# Patient Record
Sex: Male | Born: 1980 | Race: Black or African American | Hispanic: No | Marital: Single | Smoking: Former smoker
Health system: Southern US, Community
[De-identification: ages and names within clinical notes are randomized; demographics above are authoritative.]

## PROBLEM LIST (undated history)

## (undated) DIAGNOSIS — G44009 Cluster headache syndrome, unspecified, not intractable: Secondary | ICD-10-CM

## (undated) DIAGNOSIS — L709 Acne, unspecified: Secondary | ICD-10-CM

## (undated) DIAGNOSIS — M9250 Juvenile osteochondrosis of tibia and fibula, unspecified leg: Secondary | ICD-10-CM

## (undated) DIAGNOSIS — I10 Essential (primary) hypertension: Secondary | ICD-10-CM

## (undated) DIAGNOSIS — W3400XA Accidental discharge from unspecified firearms or gun, initial encounter: Secondary | ICD-10-CM

## (undated) DIAGNOSIS — M21371 Foot drop, right foot: Secondary | ICD-10-CM

## (undated) DIAGNOSIS — M92529 Juvenile osteochondrosis of tibia tubercle, unspecified leg: Secondary | ICD-10-CM

## (undated) DIAGNOSIS — S31139A Puncture wound of abdominal wall without foreign body, unspecified quadrant without penetration into peritoneal cavity, initial encounter: Secondary | ICD-10-CM

## (undated) HISTORY — DX: Juvenile osteochondrosis of tibia tubercle, unspecified leg: M92.529

## (undated) HISTORY — DX: Acne, unspecified: L70.9

## (undated) HISTORY — PX: COLECTOMY: SHX59

## (undated) HISTORY — PX: HERNIA REPAIR: SHX51

## (undated) HISTORY — DX: Juvenile osteochondrosis of tibia and fibula, unspecified leg: M92.50

## (undated) HISTORY — PX: TESTICLE REMOVAL: SHX68

---
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
## 2003-05-31 ENCOUNTER — Inpatient Hospital Stay (HOSPITAL_COMMUNITY): Admission: EM | Admit: 2003-05-31 | Discharge: 2003-06-02 | Payer: Self-pay | Admitting: Emergency Medicine

## 2003-05-31 ENCOUNTER — Encounter: Payer: Self-pay | Admitting: Emergency Medicine

## 2003-06-02 ENCOUNTER — Encounter: Payer: Self-pay | Admitting: Internal Medicine

## 2003-06-03 ENCOUNTER — Emergency Department (HOSPITAL_COMMUNITY): Admission: EM | Admit: 2003-06-03 | Discharge: 2003-06-03 | Payer: Self-pay | Admitting: Emergency Medicine

## 2003-08-05 ENCOUNTER — Ambulatory Visit (HOSPITAL_COMMUNITY): Admission: RE | Admit: 2003-08-05 | Discharge: 2003-08-05 | Payer: Self-pay | Admitting: Gastroenterology

## 2009-06-06 ENCOUNTER — Encounter (INDEPENDENT_AMBULATORY_CARE_PROVIDER_SITE_OTHER): Payer: Self-pay | Admitting: General Surgery

## 2009-06-06 ENCOUNTER — Inpatient Hospital Stay (HOSPITAL_COMMUNITY): Admission: AC | Admit: 2009-06-06 | Discharge: 2009-06-13 | Payer: Self-pay

## 2009-06-09 ENCOUNTER — Ambulatory Visit: Payer: Self-pay | Admitting: Physical Medicine & Rehabilitation

## 2009-07-06 ENCOUNTER — Emergency Department (HOSPITAL_COMMUNITY): Admission: EM | Admit: 2009-07-06 | Discharge: 2009-07-06 | Payer: Self-pay | Admitting: Emergency Medicine

## 2009-07-26 ENCOUNTER — Encounter: Admission: RE | Admit: 2009-07-26 | Discharge: 2009-08-24 | Payer: Self-pay | Admitting: Orthopedic Surgery

## 2009-09-07 ENCOUNTER — Encounter: Admission: RE | Admit: 2009-09-07 | Discharge: 2009-12-06 | Payer: Self-pay | Admitting: Orthopedic Surgery

## 2009-10-01 ENCOUNTER — Emergency Department (HOSPITAL_COMMUNITY): Admission: EM | Admit: 2009-10-01 | Discharge: 2009-10-01 | Payer: Self-pay | Admitting: Emergency Medicine

## 2010-01-10 ENCOUNTER — Encounter: Admission: RE | Admit: 2010-01-10 | Discharge: 2010-02-01 | Payer: Self-pay | Admitting: Orthopedic Surgery

## 2010-01-17 ENCOUNTER — Emergency Department (HOSPITAL_COMMUNITY): Admission: EM | Admit: 2010-01-17 | Discharge: 2010-01-17 | Payer: Self-pay | Admitting: Family Medicine

## 2010-08-11 ENCOUNTER — Ambulatory Visit: Payer: Self-pay | Admitting: Family Medicine

## 2010-08-11 DIAGNOSIS — M216X9 Other acquired deformities of unspecified foot: Secondary | ICD-10-CM | POA: Insufficient documentation

## 2010-08-11 DIAGNOSIS — G609 Hereditary and idiopathic neuropathy, unspecified: Secondary | ICD-10-CM | POA: Insufficient documentation

## 2010-09-06 ENCOUNTER — Ambulatory Visit
Admission: RE | Admit: 2010-09-06 | Discharge: 2010-09-06 | Payer: Self-pay | Source: Home / Self Care | Attending: Sports Medicine | Admitting: Sports Medicine

## 2010-09-28 ENCOUNTER — Ambulatory Visit: Payer: Self-pay | Admitting: Family Medicine

## 2010-09-28 ENCOUNTER — Encounter: Payer: Self-pay | Admitting: Family Medicine

## 2010-09-28 ENCOUNTER — Ambulatory Visit: Admit: 2010-09-28 | Payer: Self-pay

## 2010-09-28 DIAGNOSIS — J45909 Unspecified asthma, uncomplicated: Secondary | ICD-10-CM | POA: Insufficient documentation
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
## 2010-10-11 ENCOUNTER — Ambulatory Visit: Payer: Self-pay | Attending: Family Medicine | Admitting: Physical Therapy

## 2010-10-11 DIAGNOSIS — R262 Difficulty in walking, not elsewhere classified: Secondary | ICD-10-CM | POA: Insufficient documentation

## 2010-10-11 DIAGNOSIS — M545 Low back pain, unspecified: Secondary | ICD-10-CM | POA: Insufficient documentation

## 2010-10-11 DIAGNOSIS — IMO0001 Reserved for inherently not codable concepts without codable children: Secondary | ICD-10-CM | POA: Insufficient documentation

## 2010-10-11 DIAGNOSIS — M216X9 Other acquired deformities of unspecified foot: Secondary | ICD-10-CM | POA: Insufficient documentation

## 2010-11-06 ENCOUNTER — Ambulatory Visit: Payer: Self-pay | Admitting: Physical Therapy

## 2010-11-15 LAB — URINALYSIS, ROUTINE W REFLEX MICROSCOPIC
Bilirubin Urine: NEGATIVE
Hgb urine dipstick: NEGATIVE
Protein, ur: 30 mg/dL — AB
Specific Gravity, Urine: 1.024 (ref 1.005–1.030)
Urobilinogen, UA: 1 mg/dL (ref 0.0–1.0)
pH: 8.5 — ABNORMAL HIGH (ref 5.0–8.0)

## 2010-11-15 LAB — CBC
HCT: 46 % (ref 39.0–52.0)
Hemoglobin: 15.7 g/dL (ref 13.0–17.0)
RDW: 15.5 % (ref 11.5–15.5)
WBC: 8.8 10*3/uL (ref 4.0–10.5)

## 2010-11-15 LAB — BASIC METABOLIC PANEL
CO2: 24 mEq/L (ref 19–32)
Chloride: 100 mEq/L (ref 96–112)
Creatinine, Ser: 0.8 mg/dL (ref 0.4–1.5)
GFR calc Af Amer: >60 mL/min (ref >60)
GFR calc non Af Amer: >60 mL/min (ref >60)
Glucose, Bld: 114 mg/dL — ABNORMAL HIGH (ref 70–99)

## 2010-11-15 LAB — DIFFERENTIAL
Eosinophils Relative: 0 % (ref 0–5)
Lymphocytes Relative: 5 % — ABNORMAL LOW (ref 12–46)
Monocytes Absolute: 0.9 10*3/uL (ref 0.1–1.0)
Neutro Abs: 7.4 10*3/uL (ref 1.7–7.7)
Neutrophils Relative %: 84 % — ABNORMAL HIGH (ref 43–77)

## 2010-11-15 LAB — URINE MICROSCOPIC-ADD ON

## 2010-11-28 ENCOUNTER — Ambulatory Visit: Payer: Self-pay | Admitting: Rehabilitation

## 2010-11-30 LAB — DIFFERENTIAL
Basophils Absolute: 0.1 10*3/uL (ref 0.0–0.1)
Basophils Relative: 1 % (ref 0–1)
Basophils Relative: 1 % (ref 0–1)
Eosinophils Absolute: 0 10*3/uL (ref 0.0–0.7)
Eosinophils Absolute: 0 10*3/uL (ref 0.0–0.7)
Eosinophils Relative: 0 % (ref 0–5)
Eosinophils Relative: 1 % (ref 0–5)
Lymphocytes Relative: 8 % — ABNORMAL LOW (ref 12–46)
Lymphs Abs: 1.1 10*3/uL (ref 0.7–4.0)
Monocytes Absolute: 0.9 10*3/uL (ref 0.1–1.0)
Monocytes Absolute: 1.1 10*3/uL — ABNORMAL HIGH (ref 0.1–1.0)
Monocytes Relative: 15 % — ABNORMAL HIGH (ref 3–12)
Neutro Abs: 6.4 10*3/uL (ref 1.7–7.7)
Neutrophils Relative %: 76 % (ref 43–77)

## 2010-11-30 LAB — PROTIME-INR
INR: 1.15 (ref 0.00–1.49)
Prothrombin Time: 14.6 seconds (ref 11.6–15.2)

## 2010-11-30 LAB — PREPARE FRESH FROZEN PLASMA

## 2010-11-30 LAB — POCT CARDIAC MARKERS
CKMB, poc: <1 ng/mL — ABNORMAL LOW (ref 1.0–8.0)
Myoglobin, poc: 197 ng/mL (ref 12–200)

## 2010-11-30 LAB — CBC
HCT: 27 % — ABNORMAL LOW (ref 39.0–52.0)
HCT: 29.6 % — ABNORMAL LOW (ref 39.0–52.0)
HCT: 36.2 % — ABNORMAL LOW (ref 39.0–52.0)
HCT: 36.5 % — ABNORMAL LOW (ref 39.0–52.0)
Hemoglobin: 10.2 g/dL — ABNORMAL LOW (ref 13.0–17.0)
Hemoglobin: 10.2 g/dL — ABNORMAL LOW (ref 13.0–17.0)
Hemoglobin: 11.6 g/dL — ABNORMAL LOW (ref 13.0–17.0)
Hemoglobin: 12.8 g/dL — ABNORMAL LOW (ref 13.0–17.0)
Hemoglobin: 9.3 g/dL — ABNORMAL LOW (ref 13.0–17.0)
MCHC: 34.4 g/dL (ref 30.0–36.0)
MCHC: 34.5 g/dL (ref 30.0–36.0)
MCHC: 34.9 g/dL (ref 30.0–36.0)
MCV: 95.9 fL (ref 78.0–100.0)
MCV: 96 fL (ref 78.0–100.0)
MCV: 96.4 fL (ref 78.0–100.0)
MCV: 96.7 fL (ref 78.0–100.0)
MCV: 98 fL (ref 78.0–100.0)
Platelets: 146 10*3/uL — ABNORMAL LOW (ref 150–400)
Platelets: 199 10*3/uL (ref 150–400)
Platelets: 232 10*3/uL (ref 150–400)
Platelets: 292 10*3/uL (ref 150–400)
RBC: 2.8 MIL/uL — ABNORMAL LOW (ref 4.22–5.81)
RBC: 3.04 MIL/uL — ABNORMAL LOW (ref 4.22–5.81)
RBC: 3.49 MIL/uL — ABNORMAL LOW (ref 4.22–5.81)
RBC: 3.83 MIL/uL — ABNORMAL LOW (ref 4.22–5.81)
RBC: 4.18 MIL/uL — ABNORMAL LOW (ref 4.22–5.81)
RDW: 14.1 % (ref 11.5–15.5)
RDW: 14.6 % (ref 11.5–15.5)
RDW: 15.4 % (ref 11.5–15.5)
RDW: 15.5 % (ref 11.5–15.5)
RDW: 15.7 % — ABNORMAL HIGH (ref 11.5–15.5)

## 2010-11-30 LAB — BASIC METABOLIC PANEL
BUN: 2 mg/dL — ABNORMAL LOW (ref 6–23)
Calcium: 6.8 mg/dL — ABNORMAL LOW (ref 8.4–10.5)
Chloride: 104 mEq/L (ref 96–112)
Creatinine, Ser: 1.07 mg/dL (ref 0.4–1.5)
GFR calc Af Amer: >60 mL/min (ref >60)
Glucose, Bld: 105 mg/dL — ABNORMAL HIGH (ref 70–99)
Glucose, Bld: 249 mg/dL — ABNORMAL HIGH (ref 70–99)
Potassium: 3.5 mEq/L (ref 3.5–5.1)
Sodium: 136 mEq/L (ref 135–145)
Sodium: 139 mEq/L (ref 135–145)

## 2010-11-30 LAB — HEMOGLOBIN AND HEMATOCRIT, BLOOD
HCT: 33.1 % — ABNORMAL LOW (ref 39.0–52.0)
Hemoglobin: 11.3 g/dL — ABNORMAL LOW (ref 13.0–17.0)

## 2010-11-30 LAB — POCT I-STAT, CHEM 8
Calcium, Ion: 0.98 mmol/L — ABNORMAL LOW (ref 1.12–1.32)
Glucose, Bld: 173 mg/dL — ABNORMAL HIGH (ref 70–99)
Hemoglobin: 14.6 g/dL (ref 13.0–17.0)
Sodium: 140 meq/L (ref 135–145)
TCO2: 14 mmol/L (ref 0–100)

## 2010-11-30 LAB — GLUCOSE, CAPILLARY: Glucose-Capillary: 112 mg/dL — ABNORMAL HIGH (ref 70–99)

## 2010-11-30 LAB — TYPE AND SCREEN

## 2010-11-30 LAB — LACTIC ACID, PLASMA: Lactic Acid, Venous: 9.8 mmol/L — ABNORMAL HIGH (ref 0.5–2.2)

## 2010-12-11 ENCOUNTER — Ambulatory Visit: Payer: Self-pay | Attending: Family Medicine | Admitting: Physical Therapy

## 2010-12-11 DIAGNOSIS — IMO0001 Reserved for inherently not codable concepts without codable children: Secondary | ICD-10-CM | POA: Insufficient documentation

## 2010-12-11 DIAGNOSIS — R262 Difficulty in walking, not elsewhere classified: Secondary | ICD-10-CM | POA: Insufficient documentation

## 2010-12-11 DIAGNOSIS — M545 Low back pain, unspecified: Secondary | ICD-10-CM | POA: Insufficient documentation

## 2010-12-11 DIAGNOSIS — M216X9 Other acquired deformities of unspecified foot: Secondary | ICD-10-CM | POA: Insufficient documentation

## 2010-12-18 ENCOUNTER — Ambulatory Visit: Payer: Self-pay | Admitting: Physical Therapy

## 2010-12-25 ENCOUNTER — Ambulatory Visit: Payer: Self-pay | Admitting: Physical Therapy

## 2011-01-01 ENCOUNTER — Ambulatory Visit: Payer: Self-pay | Attending: Family Medicine | Admitting: Physical Therapy

## 2011-01-01 DIAGNOSIS — M545 Low back pain, unspecified: Secondary | ICD-10-CM | POA: Insufficient documentation

## 2011-01-01 DIAGNOSIS — M216X9 Other acquired deformities of unspecified foot: Secondary | ICD-10-CM | POA: Insufficient documentation

## 2011-01-01 DIAGNOSIS — R262 Difficulty in walking, not elsewhere classified: Secondary | ICD-10-CM | POA: Insufficient documentation

## 2011-01-01 DIAGNOSIS — IMO0001 Reserved for inherently not codable concepts without codable children: Secondary | ICD-10-CM | POA: Insufficient documentation

## 2011-01-12 NOTE — Discharge Summary (Signed)
NAMEPHIL, William Kane NO.:  [id number]   MEDICAL RECORD NO.:  [id number]                   PATIENT TYPE:  INP   LOCATION:  6703                                 FACILITY:  [facility]   PHYSICIAN:  Norwood Levo, MD               DATE OF BIRTH:  [birth year]   DATE OF ADMISSION:  05/31/2003  DATE OF DISCHARGE:  06/02/2003                                 DISCHARGE SUMMARY   ENCOMPASS DISCHARGE SUMMARY:   PRIMARY CARE PHYSICIAN:  Sharlot Gowda, M.D.   CHIEF COMPLAINT:  Abdominal pain, nausea, vomiting, and diarrhea.   HISTORY OF PRESENT ILLNESS:  A 30 year old African-American maleican male with a past  medical history significant for asthma and cluster headaches presented to  the emergency room with an episode of transient abdominal pain located all  over.  Nausea and vomiting x1 with runny stools.  At approximately 12 noon  the day of admission, the patient had eaten fried pork chops, french fries,  and 10 minutes later began to have crampy abdominal pain and diarrhea.  He  took [employer], but had another episode of vomiting.  He denies any  hematemesis, bloody stool, no black tarry stool.  He had three to four days  of runny stools the same day of admission and that evening as well.  He  states that the stool appeared a clear yellow with some mucus.  He denies  any bright red blood per rectum or any coffee ground emesis.  He denies any  fevers, chills, cough, any painful urination or weight loss.  He did  describe a sore on his left elbow a week prior with resolution.  He denies  any swelling or erythema or rash over the elbow.  No conjunctival  irritation, urethral discharge.  He denies any history of anal intercourse  or homosexual activity.  He states that he had eaten at [employer] and  another local seafood restaurant a day prior to admission with a combination  of shrimp and lobster, tossed salad with ranch dressing, crab dip, and  leftover steak.   His girlfriend did eat similar foods at the seafood  restaurant, but was symptom free.   PAST MEDICAL HISTORY:  1. Asthma.  2. Cluster headaches.  3. Status post inguinal hernia repair at age [age].  4. 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   MEDICATIONS:  The patient denies any medications on a regular basis.  Uses  OTC Claritin and Benadryl p.r.n.  Does use a Ventolin inhaler p.r.n.   ALLERGIES:  No known drug allergies.   SOCIAL HISTORY:  Single, lives with his girlfriend in [place], [place].  Employed.  Smokes 1/2 package of cigarettes per day over the last four  years.  He smokes marijuana daily.  He drinks beer occasionally, and denies  any other polysubstance abuse or intravenous drug use.  He  completed grade  twelve and he is literate.   FAMILY HISTORY:  Mother is 25 years of age, has hypertension and asthma.  Father is 41 years of age without any health problems.  The patient has  seven siblings with one brother with an asthma history.   REVIEW OF SYSTEMS:  All negative except for what is described in the HPI.   ADMISSION PHYSICAL EXAMINATION:  VITAL SIGNS:  Temperature 98.9, blood  pressure 114/47, pulse 50, respiratory rate 20, pulse oximetry 99% on room  air.  GENERAL:  The patient is a pleasant, well-nourished, 30 year old African-  American male in no acute distress status post morphine in the emergency  room.  HEENT:  NCAT.  PERRL.  EOMI.  Nonicteric conjunctivae.  Tympanic membranes  clear bilaterally.  Nasal mucosa moist without discharge.  No sinus  tenderness.  Good dentition.  No exudates or erythema in the oral or nasal  vault.  NECK:  Supple, no adenopathy, no thyromegaly, no bruits.  LUNGS:  CTAP.  HEART:  RRR.  S1 and S2.  ABDOMEN:  Bowel sounds are hypoactive.  Abdomen is soft, nontender,  nondistended, without masses, no hepatosplenomegaly.  RECTAL:  A small amount of rectal stool in the vault with negative guaiac as  per  Dr. Jacques Navy exam in the ED.  No rectal masses or rectal fissures or  anal abnormalities.  EXTREMITIES:  No CCE.  Good range of motion in all major joints without  joint abnormalities.  Pedal pulses 2+ bilaterally.  No Achille tendinitis,  no pretibial edema.  SKIN:  No rashes.  NEUROLOGIC:  A&O x3, 7 through 12 grossly intact.   ADMISSION LABORATORY DATA:  Urinalysis is within normal limits.  Sodium 139,  potassium 4.1, chloride 105, BUN 13, glucose 90, creatinine 0.9, WBC 7.8,  hemoglobin 15.6, hematocrit 45.8, MCV 94.5, and platelet count 259,000.  Abdominal CAT scan shows thickening of the walls of the distal small bowel  with a small amount of free pelvic fluid.   HOSPITAL COURSE:  The patient was admitted to a regular medical ward for  presumptive gastroenteritis with possible ileitis secondary to IBD, possible  Crohn's as per the abdominal CAT scan.  He was given bowel rest with  intravenous fluids, n.p.o. except for ice chips.  He was given Protonix  intravenously and antibiotic coverage with Cipro 400 IV q.12h. and Flagyl  500 IV q.8h.  His intravenous fluids are D5 normal saline with 20 mEq of KCL  at 150 per hour.  The patient undergoes stool evaluation for routine  culture, O&P, WBCs, C. diff, and E. histolytica.  The patient is also  evaluated by Dr. Madilyn Fireman of gastroenterology for possible IBD.   Erythrocyte sedimentation rate is 10 and 1.  Urinalysis is negative.  Liver  function tests are in the norm.  Amylase and lipase are also noted.  Stool  cultures, O&P, and C. difficile are negative to date.  Other serologies are  pending.  In 48 hours, the patient remains afebrile without any laboratory  abnormalities and no WBC count abnormality.  After discussion with Dr.  Madilyn Fireman, bowel rest was continued.  Within 24 hours the patient's diet is  advanced from clear liquids to full liquids to an advanced diet of low residue.  The patient does have some dyspepsia with his first  full meal, but  no right lower quadrant pain.  After discussion with the patient and the  patient's mother, and Dr. Madilyn Fireman, a decision was made to  advance diet,  observe, and discharge the patient with gastroenterology followup in two  weeks with Dr. Madilyn Fireman for possible further evaluation by BD if indicated with  possible colonoscopy.  These things have been explained to the patient and  mother, and to the patient repeatedly.  Followup KUB the morning of  discharge has no abnormalities.  Thus, intravenous fluids are discontinued,  Flagyl is discontinued, antibiotics are changed to Cipro orally 500 p.o.  b.i.d.  The patient is changed from intravenous morphine for transient pain  to Vicodin.   He is covered prophylactically with NicoDerm patch for his tobacco use and  with Ventolin p.r.n., but is without asthmatic episodes.   DISCHARGE DIAGNOSES:  1. Gastroenteritis.  2. Possible irritable bowel disease/Crohn's - to be further worked up.  3. Asthma.  4. Tobacco abuse.  5. Cluster headaches.   DISCHARGE MEDICATIONS:  1. Nasonex one spray q.d. p.r.n.  2. Cipro 500 mg p.o. q.d. x7 days.  3. Vicodin (5/500) one tab p.o. q.6h. p.r.n.  4. Ventolin meter dose inhaler two puffs q.4-6h. p.r.n.   DISCHARGE INSTRUCTIONS:  1. Follow a low residue diet (the patient given instruction leaflet).  2. Follow up with Dr. Madilyn Fireman in two weeks.  Call for an appointment at [phone].  3. Return to emergency room if repeated abdominal pain or nausea, vomiting,     diarrhea, or bloody stool.                                                Norwood Levo, MD    APM/MEDQ  D:  06/02/2003  T:  06/03/2003  Job:  [id number]   cc:   Everardo All. Madilyn Fireman, M.D.  [address]  [place]  [place] [address]  Fax: [phone]   Elliot Cousin, M.D.
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
## 2011-02-14 ENCOUNTER — Other Ambulatory Visit: Payer: Self-pay | Admitting: *Deleted

## 2011-03-02 ENCOUNTER — Other Ambulatory Visit: Payer: Self-pay | Admitting: Family Medicine

## 2011-03-02 DIAGNOSIS — G629 Polyneuropathy, unspecified: Secondary | ICD-10-CM

## 2011-03-02 MED ORDER — AMITRIPTYLINE HCL 10 MG PO TABS: 10.0000 mg | ORAL_TABLET | Freq: Every day | ORAL | Status: DC

## 2011-05-10 ENCOUNTER — Encounter: Payer: Self-pay | Admitting: Family Medicine

## 2012-03-06 ENCOUNTER — Emergency Department (HOSPITAL_COMMUNITY): Payer: Self-pay

## 2012-03-06 ENCOUNTER — Encounter (HOSPITAL_COMMUNITY): Payer: Self-pay | Admitting: Nurse Practitioner

## 2012-03-06 ENCOUNTER — Emergency Department (HOSPITAL_COMMUNITY)
Admission: EM | Admit: 2012-03-06 | Discharge: 2012-03-06 | Disposition: A | Discharge status: Home / Self Care | Payer: Self-pay | Attending: Emergency Medicine | Admitting: Emergency Medicine

## 2012-03-06 DIAGNOSIS — J45909 Unspecified asthma, uncomplicated: Secondary | ICD-10-CM | POA: Insufficient documentation

## 2012-03-06 DIAGNOSIS — M928 Other specified juvenile osteochondrosis: Secondary | ICD-10-CM | POA: Insufficient documentation

## 2012-03-06 DIAGNOSIS — R109 Unspecified abdominal pain: Secondary | ICD-10-CM | POA: Insufficient documentation

## 2012-03-06 HISTORY — DX: Accidental discharge from unspecified firearms or gun, initial encounter: W34.00XA

## 2012-03-06 HISTORY — DX: Puncture wound of abdominal wall without foreign body, unspecified quadrant without penetration into peritoneal cavity, initial encounter: S31.139A

## 2012-03-06 LAB — CBC WITH DIFFERENTIAL/PLATELET
Basophils Absolute: 0 10*3/uL (ref 0.0–0.1)
Basophils Relative: 0 % (ref 0–1)
Eosinophils Absolute: 0.1 10*3/uL (ref 0.0–0.7)
Eosinophils Relative: 1 % (ref 0–5)
HCT: 45.5 % (ref 39.0–52.0)
MCH: 34.2 pg — ABNORMAL HIGH (ref 26.0–34.0)
MCHC: 35.6 g/dL (ref 30.0–36.0)
MCV: 96 fL (ref 78.0–100.0)
Monocytes Absolute: 1.3 10*3/uL — ABNORMAL HIGH (ref 0.1–1.0)
Platelets: 326 10*3/uL (ref 150–400)
RDW: 13 % (ref 11.5–15.5)

## 2012-03-06 LAB — COMPREHENSIVE METABOLIC PANEL
ALT: 37 U/L (ref 0–53)
AST: 36 U/L (ref 0–37)
Albumin: 4.4 g/dL (ref 3.5–5.2)
CO2: 24 mEq/L (ref 19–32)
Calcium: 9.2 mg/dL (ref 8.4–10.5)
Creatinine, Ser: 0.9 mg/dL (ref 0.50–1.35)
GFR calc non Af Amer: >90 mL/min (ref >90)
Sodium: 140 mEq/L (ref 135–145)
Total Protein: 8.1 g/dL (ref 6.0–8.3)

## 2012-03-06 LAB — URINALYSIS, ROUTINE W REFLEX MICROSCOPIC
Glucose, UA: NEGATIVE mg/dL
Hgb urine dipstick: NEGATIVE
Ketones, ur: 40 mg/dL — AB
Protein, ur: NEGATIVE mg/dL
Urobilinogen, UA: 0.2 mg/dL (ref 0.0–1.0)

## 2012-03-06 MED ORDER — SODIUM CHLORIDE 0.9 % IV BOLUS (SEPSIS)
1000.0000 mL | Freq: Once | INTRAVENOUS | Status: AC
  Administered 2012-03-06: 1000 mL via INTRAVENOUS

## 2012-03-06 MED ORDER — HYDROCODONE-ACETAMINOPHEN 5-325 MG PO TABS: 1.0000 | ORAL_TABLET | ORAL | Status: AC | PRN

## 2012-03-06 MED ORDER — MORPHINE SULFATE 4 MG/ML IJ SOLN
4.0000 mg | Freq: Once | INTRAMUSCULAR | Status: AC
Start: 2012-03-06 — End: 2012-03-06
  Administered 2012-03-06: 4 mg via INTRAVENOUS
  Filled 2012-03-06: qty 1

## 2012-03-06 MED ORDER — ONDANSETRON HCL 4 MG/2ML IJ SOLN
4.0000 mg | Freq: Once | INTRAMUSCULAR | Status: AC
  Administered 2012-03-06: 4 mg via INTRAVENOUS
  Filled 2012-03-06: qty 2

## 2012-03-06 MED ORDER — MORPHINE SULFATE 4 MG/ML IJ SOLN
4.0000 mg | Freq: Once | INTRAMUSCULAR | Status: AC
  Administered 2012-03-06: 4 mg via INTRAVENOUS
  Filled 2012-03-06: qty 1

## 2012-03-06 MED ORDER — IOHEXOL 300 MG/ML  SOLN
100.0000 mL | Freq: Once | INTRAMUSCULAR | Status: AC | PRN
  Administered 2012-03-06: 100 mL via INTRAVENOUS

## 2012-03-06 NOTE — ED Notes (Signed)
Pt states that he had a sudden onset of cramping abdominal pain today. Pain is intermittent but when it hits it is sharp and 10 out of 10. Pt has had 2 "mucous" bowel movements today but no vomiting. Pt states that he was completely normal yesterday. Pt denies vomiting, fever, or pain anywhere other than his lower abdomen.
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
## 2012-03-06 NOTE — ED Notes (Signed)
Pt ambulatory at discharge, ride at bedside

## 2012-03-06 NOTE — ED Notes (Signed)
Patient transported to CT 

## 2012-03-06 NOTE — ED Provider Notes (Signed)
Medical screening examination/treatment/procedure(s) were conducted as a shared visit with non-physician practitioner(s) and myself.  I personally evaluated the patient during the encounter   William Octave, MD 03/06/12 2352
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
## 2012-10-01 ENCOUNTER — Emergency Department (INDEPENDENT_AMBULATORY_CARE_PROVIDER_SITE_OTHER)
Admission: EM | Admit: 2012-10-01 | Discharge: 2012-10-01 | Disposition: A | Discharge status: Home / Self Care | Payer: Self-pay | Source: Home / Self Care | Attending: Family Medicine | Admitting: Family Medicine

## 2012-10-01 ENCOUNTER — Encounter (HOSPITAL_COMMUNITY): Payer: Self-pay | Admitting: *Deleted

## 2012-10-01 DIAGNOSIS — G629 Polyneuropathy, unspecified: Secondary | ICD-10-CM

## 2012-10-01 DIAGNOSIS — G609 Hereditary and idiopathic neuropathy, unspecified: Secondary | ICD-10-CM

## 2012-10-01 MED ORDER — HYDROCODONE-ACETAMINOPHEN 5-325 MG PO TABS
1.0000 | ORAL_TABLET | Freq: Once | ORAL | Status: AC
Start: 2012-10-01 — End: 2012-10-01
  Administered 2012-10-01: 1 via ORAL

## 2012-10-01 MED ORDER — HYDROCODONE-ACETAMINOPHEN 5-325 MG PO TABS: 2.0000 | ORAL_TABLET | ORAL | Status: DC | PRN

## 2012-10-01 MED ORDER — IBUPROFEN 600 MG PO TABS: 600.0000 mg | ORAL_TABLET | Freq: Three times a day (TID) | ORAL | Status: DC

## 2012-10-01 MED ORDER — KETOROLAC TROMETHAMINE 30 MG/ML IJ SOLN
30.0000 mg | Freq: Once | INTRAMUSCULAR | Status: AC
  Administered 2012-10-01: 30 mg via INTRAMUSCULAR

## 2012-10-01 MED ORDER — GABAPENTIN (ONCE-DAILY) 300 MG PO TABS: 1.0000 | ORAL_TABLET | Freq: Two times a day (BID) | ORAL | Status: DC

## 2012-10-01 MED ORDER — KETOROLAC TROMETHAMINE 30 MG/ML IJ SOLN
INTRAMUSCULAR | Status: AC
  Filled 2012-10-01: qty 1

## 2012-10-01 MED ORDER — TRAMADOL HCL 50 MG PO TABS: 50.0000 mg | ORAL_TABLET | Freq: Four times a day (QID) | ORAL | Status: DC | PRN

## 2012-10-01 MED ORDER — HYDROCODONE-ACETAMINOPHEN 5-325 MG PO TABS
ORAL_TABLET | ORAL | Status: AC
  Filled 2012-10-01: qty 1
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
## 2013-01-06 ENCOUNTER — Emergency Department (HOSPITAL_COMMUNITY)
Admission: EM | Admit: 2013-01-06 | Discharge: 2013-01-06 | Disposition: A | Discharge status: Home / Self Care | Payer: No Typology Code available for payment source | Attending: Emergency Medicine | Admitting: Emergency Medicine

## 2013-01-06 ENCOUNTER — Emergency Department (HOSPITAL_COMMUNITY): Payer: No Typology Code available for payment source

## 2013-01-06 ENCOUNTER — Encounter (HOSPITAL_COMMUNITY): Payer: Self-pay

## 2013-01-06 DIAGNOSIS — R1011 Right upper quadrant pain: Secondary | ICD-10-CM | POA: Insufficient documentation

## 2013-01-06 DIAGNOSIS — Y929 Unspecified place or not applicable: Secondary | ICD-10-CM | POA: Insufficient documentation

## 2013-01-06 DIAGNOSIS — Z79899 Other long term (current) drug therapy: Secondary | ICD-10-CM | POA: Insufficient documentation

## 2013-01-06 DIAGNOSIS — J45909 Unspecified asthma, uncomplicated: Secondary | ICD-10-CM | POA: Insufficient documentation

## 2013-01-06 DIAGNOSIS — Z87891 Personal history of nicotine dependence: Secondary | ICD-10-CM | POA: Insufficient documentation

## 2013-01-06 DIAGNOSIS — R112 Nausea with vomiting, unspecified: Secondary | ICD-10-CM | POA: Insufficient documentation

## 2013-01-06 DIAGNOSIS — Z87828 Personal history of other (healed) physical injury and trauma: Secondary | ICD-10-CM | POA: Insufficient documentation

## 2013-01-06 DIAGNOSIS — S92301A Fracture of unspecified metatarsal bone(s), right foot, initial encounter for closed fracture: Secondary | ICD-10-CM

## 2013-01-06 DIAGNOSIS — M928 Other specified juvenile osteochondrosis: Secondary | ICD-10-CM | POA: Insufficient documentation

## 2013-01-06 DIAGNOSIS — X500XXA Overexertion from strenuous movement or load, initial encounter: Secondary | ICD-10-CM | POA: Insufficient documentation

## 2013-01-06 DIAGNOSIS — S92309A Fracture of unspecified metatarsal bone(s), unspecified foot, initial encounter for closed fracture: Secondary | ICD-10-CM | POA: Insufficient documentation

## 2013-01-06 DIAGNOSIS — Y9389 Activity, other specified: Secondary | ICD-10-CM | POA: Insufficient documentation

## 2013-01-06 LAB — CBC WITH DIFFERENTIAL/PLATELET
Basophils Absolute: 0 10*3/uL (ref 0.0–0.1)
Basophils Relative: 1 % (ref 0–1)
Eosinophils Absolute: 0.3 10*3/uL (ref 0.0–0.7)
Eosinophils Relative: 4 % (ref 0–5)
HCT: 46 % (ref 39.0–52.0)
Hemoglobin: 16.1 g/dL (ref 13.0–17.0)
Lymphocytes Relative: 36 % (ref 12–46)
Lymphs Abs: 2.4 10*3/uL (ref 0.7–4.0)
MCH: 32.7 pg (ref 26.0–34.0)
MCHC: 35 g/dL (ref 30.0–36.0)
MCV: 93.5 fL (ref 78.0–100.0)
Monocytes Absolute: 1.1 10*3/uL — ABNORMAL HIGH (ref 0.1–1.0)
Monocytes Relative: 16 % — ABNORMAL HIGH (ref 3–12)
Neutro Abs: 2.9 10*3/uL (ref 1.7–7.7)
Neutrophils Relative %: 43 % (ref 43–77)
Platelets: 354 10*3/uL (ref 150–400)
RBC: 4.92 MIL/uL (ref 4.22–5.81)
RDW: 13.5 % (ref 11.5–15.5)
WBC: 6.6 10*3/uL (ref 4.0–10.5)

## 2013-01-06 LAB — COMPREHENSIVE METABOLIC PANEL
ALT: 21 U/L (ref 0–53)
AST: 31 U/L (ref 0–37)
Albumin: 3.8 g/dL (ref 3.5–5.2)
Alkaline Phosphatase: 45 U/L (ref 39–117)
BUN: 12 mg/dL (ref 6–23)
CO2: 23 mEq/L (ref 19–32)
Calcium: 9.3 mg/dL (ref 8.4–10.5)
Chloride: 100 mEq/L (ref 96–112)
Creatinine, Ser: 1.03 mg/dL (ref 0.50–1.35)
GFR calc Af Amer: >90 mL/min (ref >90)
GFR calc non Af Amer: >90 mL/min (ref >90)
Glucose, Bld: 95 mg/dL (ref 70–99)
Potassium: 4.4 mEq/L (ref 3.5–5.1)
Sodium: 137 mEq/L (ref 135–145)
Total Bilirubin: 0.5 mg/dL (ref 0.3–1.2)
Total Protein: 8.1 g/dL (ref 6.0–8.3)

## 2013-01-06 LAB — URINALYSIS, ROUTINE W REFLEX MICROSCOPIC
Bilirubin Urine: NEGATIVE
Glucose, UA: NEGATIVE mg/dL
Hgb urine dipstick: NEGATIVE
Ketones, ur: NEGATIVE mg/dL
Leukocytes, UA: NEGATIVE
Nitrite: NEGATIVE
Protein, ur: NEGATIVE mg/dL
Specific Gravity, Urine: 1.03 (ref 1.005–1.030)
Urobilinogen, UA: 1 mg/dL (ref 0.0–1.0)
pH: 6 (ref 5.0–8.0)

## 2013-01-06 LAB — LIPASE, BLOOD: Lipase: 21 U/L (ref 11–59)

## 2013-01-06 LAB — AMYLASE: Amylase: 88 U/L (ref 0–105)

## 2013-01-06 MED ORDER — OXYCODONE-ACETAMINOPHEN 5-325 MG PO TABS: 1.0000 | ORAL_TABLET | ORAL | Status: DC | PRN

## 2013-01-06 MED ORDER — NAPROXEN 375 MG PO TABS: 375.0000 mg | ORAL_TABLET | Freq: Two times a day (BID) | ORAL | Status: DC | PRN

## 2013-01-06 MED ORDER — OXYCODONE-ACETAMINOPHEN 5-325 MG PO TABS
2.0000 | ORAL_TABLET | Freq: Once | ORAL | Status: AC
  Administered 2013-01-06: 2 via ORAL
  Filled 2013-01-06: qty 2
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
## 2014-06-30 ENCOUNTER — Encounter (HOSPITAL_COMMUNITY): Payer: Self-pay

## 2014-06-30 ENCOUNTER — Emergency Department (HOSPITAL_COMMUNITY)
Admission: EM | Admit: 2014-06-30 | Discharge: 2014-06-30 | Disposition: A | Discharge status: Home / Self Care | Payer: No Typology Code available for payment source | Attending: Emergency Medicine | Admitting: Emergency Medicine

## 2014-06-30 ENCOUNTER — Emergency Department (HOSPITAL_COMMUNITY): Payer: No Typology Code available for payment source

## 2014-06-30 DIAGNOSIS — Z872 Personal history of diseases of the skin and subcutaneous tissue: Secondary | ICD-10-CM | POA: Insufficient documentation

## 2014-06-30 DIAGNOSIS — Z791 Long term (current) use of non-steroidal anti-inflammatories (NSAID): Secondary | ICD-10-CM | POA: Insufficient documentation

## 2014-06-30 DIAGNOSIS — X58XXXA Exposure to other specified factors, initial encounter: Secondary | ICD-10-CM | POA: Insufficient documentation

## 2014-06-30 DIAGNOSIS — Z87891 Personal history of nicotine dependence: Secondary | ICD-10-CM | POA: Insufficient documentation

## 2014-06-30 DIAGNOSIS — M25579 Pain in unspecified ankle and joints of unspecified foot: Secondary | ICD-10-CM

## 2014-06-30 DIAGNOSIS — Y9389 Activity, other specified: Secondary | ICD-10-CM | POA: Insufficient documentation

## 2014-06-30 DIAGNOSIS — Z79899 Other long term (current) drug therapy: Secondary | ICD-10-CM | POA: Insufficient documentation

## 2014-06-30 DIAGNOSIS — J45909 Unspecified asthma, uncomplicated: Secondary | ICD-10-CM | POA: Insufficient documentation

## 2014-06-30 DIAGNOSIS — Z87828 Personal history of other (healed) physical injury and trauma: Secondary | ICD-10-CM | POA: Insufficient documentation

## 2014-06-30 DIAGNOSIS — S93401A Sprain of unspecified ligament of right ankle, initial encounter: Secondary | ICD-10-CM | POA: Insufficient documentation

## 2014-06-30 DIAGNOSIS — Y9289 Other specified places as the place of occurrence of the external cause: Secondary | ICD-10-CM | POA: Insufficient documentation

## 2014-06-30 DIAGNOSIS — Z8739 Personal history of other diseases of the musculoskeletal system and connective tissue: Secondary | ICD-10-CM | POA: Insufficient documentation

## 2014-06-30 MED ORDER — HYDROCODONE-ACETAMINOPHEN 5-325 MG PO TABS
2.0000 | ORAL_TABLET | Freq: Once | ORAL | Status: AC
  Administered 2014-06-30: 2 via ORAL
  Filled 2014-06-30: qty 2

## 2014-06-30 MED ORDER — TRAMADOL HCL 50 MG PO TABS: 50.0000 mg | ORAL_TABLET | Freq: Four times a day (QID) | ORAL | Status: DC | PRN
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
## 2014-06-30 NOTE — ED Notes (Signed)
Pt escorted to discharge window. Pt verbalized understanding discharge instructions. In no acute distress.  

## 2014-06-30 NOTE — Discharge Instructions (Signed)
Take tramadol as directed for your ankle pain.  Ankle Sprain An ankle sprain is an injury to the strong, fibrous tissues (ligaments) that hold the bones of your ankle joint together.  CAUSES An ankle sprain is usually caused by a fall or by twisting your ankle. Ankle sprains most commonly occur when you step on the outer edge of your foot, and your ankle turns inward. People who participate in sports are more prone to these types of injuries.  SYMPTOMS   Pain in your ankle. The pain may be present at rest or only when you are trying to stand or walk.  Swelling.  Bruising. Bruising may develop immediately or within 1 to 2 days after your injury.  Difficulty standing or walking, particularly when turning corners or changing directions. DIAGNOSIS  Your caregiver will ask you details about your injury and perform a physical exam of your ankle to determine if you have an ankle sprain. During the physical exam, your caregiver will press on and apply pressure to specific areas of your foot and ankle. Your caregiver will try to move your ankle in certain ways. An X-ray exam may be done to be sure a bone was not broken or a ligament did not separate from one of the bones in your ankle (avulsion fracture).  TREATMENT  Certain types of braces can help stabilize your ankle. Your caregiver can make a recommendation for this. Your caregiver may recommend the use of medicine for pain. If your sprain is severe, your caregiver may refer you to a surgeon who helps to restore function to parts of your skeletal system (orthopedist) or a physical therapist. HOME CARE INSTRUCTIONS   Apply ice to your injury for 1-2 days or as directed by your caregiver. Applying ice helps to reduce inflammation and pain.  Put ice in a plastic bag.  Place a towel between your skin and the bag.  Leave the ice on for 15-20 minutes at a time, every 2 hours while you are awake.  Only take over-the-counter or prescription medicines  for pain, discomfort, or fever as directed by your caregiver.  Elevate your injured ankle above the level of your heart as much as possible for 2-3 days.  If your caregiver recommends crutches, use them as instructed. Gradually put weight on the affected ankle. Continue to use crutches or a cane until you can walk without feeling pain in your ankle.  If you have a plaster splint, wear the splint as directed by your caregiver. Do not rest it on anything harder than a pillow for the first 24 hours. Do not put weight on it. Do not get it wet. You may take it off to take a shower or bath.  You may have been given an elastic bandage to wear around your ankle to provide support. If the elastic bandage is too tight (you have numbness or tingling in your foot or your foot becomes cold and blue), adjust the bandage to make it comfortable.  If you have an air splint, you may blow more air into it or let air out to make it more comfortable. You may take your splint off at night and before taking a shower or bath. Wiggle your toes in the splint several times per day to decrease swelling. SEEK MEDICAL CARE IF:   You have rapidly increasing bruising or swelling.  Your toes feel extremely cold or you lose feeling in your foot.  Your pain is not relieved with medicine. SEEK IMMEDIATE MEDICAL  CARE IF:  Your toes are numb or blue.  You have severe pain that is increasing. MAKE SURE YOU:   Understand these instructions.  Will watch your condition.  Will get help right away if you are not doing well or get worse. Document Released: 08/13/2005 Document Revised: 05/07/2012 Document Reviewed: 08/25/2011 [facility] Patient Information 2015 [place], [place]. This information is not intended to replace advice given to you by your health care provider. Make sure you discuss any questions you have with your health care provider. RICE: Routine Care for Injuries The routine care of many injuries includes Rest, Ice,  Compression, and Elevation (RICE). HOME CARE INSTRUCTIONS  Rest is needed to allow your body to heal. Routine activities can usually be resumed when comfortable. Injured tendons and bones can take up to 6 weeks to heal. Tendons are the cord-like structures that attach muscle to bone.  Ice following an injury helps keep the swelling down and reduces pain.  Put ice in a plastic bag.  Place a towel between your skin and the bag.  Leave the ice on for 15-20 minutes, 3-4 times a day, or as directed by your health care provider. Do this while awake, for the first 24 to 48 hours. After that, continue as directed by your caregiver.  Compression helps keep swelling down. It also gives support and helps with discomfort. If an elastic bandage has been applied, it should be removed and reapplied every 3 to 4 hours. It should not be applied tightly, but firmly enough to keep swelling down. Watch fingers or toes for swelling, bluish discoloration, coldness, numbness, or excessive pain. If any of these problems occur, remove the bandage and reapply loosely. Contact your caregiver if these problems continue.  Elevation helps reduce swelling and decreases pain. With extremities, such as the arms, hands, legs, and feet, the injured area should be placed near or above the level of the heart, if possible. SEEK IMMEDIATE MEDICAL CARE IF:  You have persistent pain and swelling.  You develop redness, numbness, or unexpected weakness.  Your symptoms are getting worse rather than improving after several days. These symptoms may indicate that further evaluation or further X-rays are needed. Sometimes, X-rays may not show a small broken bone (fracture) until 1 week or 10 days later. Make a follow-up appointment with your caregiver. Ask when your X-ray results will be ready. Make sure you get your X-ray results. Document Released: 11/25/2000 Document Revised: 08/18/2013 Document Reviewed: 01/12/2011 [facility] Patient  Information 2015 [place], [place]. This information is not intended to replace advice given to you by your health care provider. Make sure you discuss any questions you have with your health care provider.

## 2014-10-07 ENCOUNTER — Emergency Department (HOSPITAL_COMMUNITY)
Admission: EM | Admit: 2014-10-07 | Discharge: 2014-10-07 | Disposition: A | Discharge status: Home / Self Care | Payer: No Typology Code available for payment source | Attending: Emergency Medicine | Admitting: Emergency Medicine

## 2014-10-07 ENCOUNTER — Encounter (HOSPITAL_COMMUNITY): Payer: Self-pay | Admitting: Emergency Medicine

## 2014-10-07 ENCOUNTER — Emergency Department (HOSPITAL_COMMUNITY): Payer: No Typology Code available for payment source

## 2014-10-07 DIAGNOSIS — M21371 Foot drop, right foot: Secondary | ICD-10-CM | POA: Insufficient documentation

## 2014-10-07 DIAGNOSIS — Z720 Tobacco use: Secondary | ICD-10-CM | POA: Insufficient documentation

## 2014-10-07 DIAGNOSIS — J45909 Unspecified asthma, uncomplicated: Secondary | ICD-10-CM | POA: Insufficient documentation

## 2014-10-07 DIAGNOSIS — Z87828 Personal history of other (healed) physical injury and trauma: Secondary | ICD-10-CM | POA: Insufficient documentation

## 2014-10-07 DIAGNOSIS — Z872 Personal history of diseases of the skin and subcutaneous tissue: Secondary | ICD-10-CM | POA: Insufficient documentation

## 2014-10-07 DIAGNOSIS — Z79899 Other long term (current) drug therapy: Secondary | ICD-10-CM | POA: Insufficient documentation

## 2014-10-07 DIAGNOSIS — M25571 Pain in right ankle and joints of right foot: Secondary | ICD-10-CM | POA: Insufficient documentation

## 2014-10-07 MED ORDER — IBUPROFEN 800 MG PO TABS: 800.0000 mg | ORAL_TABLET | Freq: Three times a day (TID) | ORAL | Status: DC

## 2014-10-07 MED ORDER — IBUPROFEN 800 MG PO TABS
800.0000 mg | ORAL_TABLET | Freq: Once | ORAL | Status: AC
  Administered 2014-10-07: 800 mg via ORAL
  Filled 2014-10-07: qty 1
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
## 2014-10-07 NOTE — Discharge Instructions (Signed)
Rest, Ice intermittently (in the first 24-48 hours), Gentle compression with an Ace wrap, and elevate (Limb above the level of the heart)  Please follow with your primary care doctor in the next 2 days for a check-up. They must obtain records for further management.   Do not hesitate to return to the Emergency Department for any new, worsening or concerning symptoms.    Ankle Pain Ankle pain is a common symptom. The bones, cartilage, tendons, and muscles of the ankle joint perform a lot of work each day. The ankle joint holds your body weight and allows you to move around. Ankle pain can occur on either side or back of 1 or both ankles. Ankle pain may be sharp and burning or dull and aching. There may be tenderness, stiffness, redness, or warmth around the ankle. The pain occurs more often when a person walks or puts pressure on the ankle. CAUSES  There are many reasons ankle pain can develop. It is important to work with your caregiver to identify the cause since many conditions can impact the bones, cartilage, muscles, and tendons. Causes for ankle pain include:  Injury, including a break (fracture), sprain, or strain often due to a fall, sports, or a high-impact activity.  Swelling (inflammation) of a tendon (tendonitis).  Achilles tendon rupture.  Ankle instability after repeated sprains and strains.  Poor foot alignment.  Pressure on a nerve (tarsal tunnel syndrome).  Arthritis in the ankle or the lining of the ankle.  Crystal formation in the ankle (gout or pseudogout). DIAGNOSIS  A diagnosis is based on your medical history, your symptoms, results of your physical exam, and results of diagnostic tests. Diagnostic tests may include X-ray exams or a computerized magnetic scan (magnetic resonance imaging, MRI). TREATMENT  Treatment will depend on the cause of your ankle pain and may include:  Keeping pressure off the ankle and limiting activities.  Using crutches or other  walking support (a cane or brace).  Using rest, ice, compression, and elevation.  Participating in physical therapy or home exercises.  Wearing shoe inserts or special shoes.  Losing weight.  Taking medications to reduce pain or swelling or receiving an injection.  Undergoing surgery. HOME CARE INSTRUCTIONS   Only take over-the-counter or prescription medicines for pain, discomfort, or fever as directed by your caregiver.  Put ice on the injured area.  Put ice in a plastic bag.  Place a towel between your skin and the bag.  Leave the ice on for 15-20 minutes at a time, 03-04 times a day.  Keep your leg raised (elevated) when possible to lessen swelling.  Avoid activities that cause ankle pain.  Follow specific exercises as directed by your caregiver.  Record how often you have ankle pain, the location of the pain, and what it feels like. This information may be helpful to you and your caregiver.  Ask your caregiver about returning to work or sports and whether you should drive.  Follow up with your caregiver for further examination, therapy, or testing as directed. SEEK MEDICAL CARE IF:   Pain or swelling continues or worsens beyond 1 week.  You have an oral temperature above 102 F (38.9 C).  You are feeling unwell or have chills.  You are having an increasingly difficult time with walking.  You have loss of sensation or other new symptoms.  You have questions or concerns. MAKE SURE YOU:   Understand these instructions.  Will watch your condition.  Will get help right away  if you are not doing well or get worse. Document Released: 01/31/2010 Document Revised: 11/05/2011 Document Reviewed: 01/31/2010 [facility] Patient Information 2015 [place], [place]. This information is not intended to replace advice given to you by your health care provider. Make sure you discuss any questions you have with your health care provider.

## 2014-10-12 ENCOUNTER — Emergency Department (HOSPITAL_COMMUNITY): Payer: Self-pay

## 2014-10-12 ENCOUNTER — Encounter (HOSPITAL_COMMUNITY): Payer: Self-pay | Admitting: Radiology

## 2014-10-12 ENCOUNTER — Emergency Department (HOSPITAL_COMMUNITY)
Admission: EM | Admit: 2014-10-12 | Discharge: 2014-10-12 | Disposition: A | Discharge status: Home / Self Care | Payer: Self-pay | Attending: Emergency Medicine | Admitting: Emergency Medicine

## 2014-10-12 ENCOUNTER — Emergency Department (HOSPITAL_COMMUNITY): Payer: No Typology Code available for payment source

## 2014-10-12 DIAGNOSIS — R059 Cough, unspecified: Secondary | ICD-10-CM

## 2014-10-12 DIAGNOSIS — R7989 Other specified abnormal findings of blood chemistry: Secondary | ICD-10-CM

## 2014-10-12 DIAGNOSIS — Z791 Long term (current) use of non-steroidal anti-inflammatories (NSAID): Secondary | ICD-10-CM | POA: Insufficient documentation

## 2014-10-12 DIAGNOSIS — J45901 Unspecified asthma with (acute) exacerbation: Secondary | ICD-10-CM | POA: Insufficient documentation

## 2014-10-12 DIAGNOSIS — Z8739 Personal history of other diseases of the musculoskeletal system and connective tissue: Secondary | ICD-10-CM | POA: Insufficient documentation

## 2014-10-12 DIAGNOSIS — Z79899 Other long term (current) drug therapy: Secondary | ICD-10-CM | POA: Insufficient documentation

## 2014-10-12 DIAGNOSIS — Z720 Tobacco use: Secondary | ICD-10-CM | POA: Insufficient documentation

## 2014-10-12 DIAGNOSIS — Z872 Personal history of diseases of the skin and subcutaneous tissue: Secondary | ICD-10-CM | POA: Insufficient documentation

## 2014-10-12 DIAGNOSIS — R05 Cough: Secondary | ICD-10-CM

## 2014-10-12 DIAGNOSIS — Z87828 Personal history of other (healed) physical injury and trauma: Secondary | ICD-10-CM | POA: Insufficient documentation

## 2014-10-12 DIAGNOSIS — R000 Tachycardia, unspecified: Secondary | ICD-10-CM | POA: Insufficient documentation

## 2014-10-12 DIAGNOSIS — R791 Abnormal coagulation profile: Secondary | ICD-10-CM | POA: Insufficient documentation

## 2014-10-12 LAB — CBC WITH DIFFERENTIAL/PLATELET
Basophils Absolute: 0 10*3/uL (ref 0.0–0.1)
Basophils Relative: 1 % (ref 0–1)
EOS ABS: 0.2 10*3/uL (ref 0.0–0.7)
Eosinophils Relative: 4 % (ref 0–5)
HCT: 45.5 % (ref 39.0–52.0)
Hemoglobin: 15.8 g/dL (ref 13.0–17.0)
LYMPHS ABS: 0.9 10*3/uL (ref 0.7–4.0)
Lymphocytes Relative: 16 % (ref 12–46)
MCH: 33.1 pg (ref 26.0–34.0)
MCHC: 34.7 g/dL (ref 30.0–36.0)
MCV: 95.2 fL (ref 78.0–100.0)
MONO ABS: 0.7 10*3/uL (ref 0.1–1.0)
MONOS PCT: 13 % — AB (ref 3–12)
NEUTROS PCT: 66 % (ref 43–77)
Neutro Abs: 3.8 10*3/uL (ref 1.7–7.7)
Platelets: 306 10*3/uL (ref 150–400)
RBC: 4.78 MIL/uL (ref 4.22–5.81)
RDW: 13.5 % (ref 11.5–15.5)
WBC: 5.7 10*3/uL (ref 4.0–10.5)

## 2014-10-12 LAB — BASIC METABOLIC PANEL
ANION GAP: 7 (ref 5–15)
BUN: 8 mg/dL (ref 6–23)
CHLORIDE: 109 mmol/L (ref 96–112)
CO2: 24 mmol/L (ref 19–32)
CREATININE: 0.9 mg/dL (ref 0.50–1.35)
Calcium: 9.2 mg/dL (ref 8.4–10.5)
Glucose, Bld: 102 mg/dL — ABNORMAL HIGH (ref 70–99)
Potassium: 3.5 mmol/L (ref 3.5–5.1)
Sodium: 140 mmol/L (ref 135–145)

## 2014-10-12 LAB — D-DIMER, QUANTITATIVE (NOT AT ARMC): D DIMER QUANT: 0.63 ug{FEU}/mL — AB (ref 0.00–0.48)

## 2014-10-12 MED ORDER — ALBUTEROL SULFATE HFA 108 (90 BASE) MCG/ACT IN AERS
1.0000 | INHALATION_SPRAY | Freq: Once | RESPIRATORY_TRACT | Status: AC
  Administered 2014-10-12: 1 via RESPIRATORY_TRACT
  Filled 2014-10-12: qty 6.7

## 2014-10-12 MED ORDER — PREDNISONE 20 MG PO TABS
60.0000 mg | ORAL_TABLET | Freq: Once | ORAL | Status: AC
  Administered 2014-10-12: 60 mg via ORAL
  Filled 2014-10-12: qty 3

## 2014-10-12 MED ORDER — IOHEXOL 350 MG/ML SOLN
100.0000 mL | Freq: Once | INTRAVENOUS | Status: AC | PRN
  Administered 2014-10-12: 100 mL via INTRAVENOUS

## 2014-10-12 MED ORDER — ONDANSETRON HCL 4 MG/2ML IJ SOLN
4.0000 mg | Freq: Once | INTRAMUSCULAR | Status: AC
  Administered 2014-10-12: 4 mg via INTRAVENOUS
  Filled 2014-10-12: qty 2

## 2014-10-12 MED ORDER — HYDROCOD POLST-CHLORPHEN POLST 10-8 MG/5ML PO LQCR: 5.0000 mL | Freq: Two times a day (BID) | ORAL | Status: DC

## 2014-10-12 MED ORDER — SODIUM CHLORIDE 0.9 % IV BOLUS (SEPSIS)
1000.0000 mL | Freq: Once | INTRAVENOUS | Status: AC
  Administered 2014-10-12: 1000 mL via INTRAVENOUS

## 2014-10-12 MED ORDER — PREDNISONE 20 MG PO TABS: 40.0000 mg | ORAL_TABLET | Freq: Every day | ORAL | Status: DC

## 2014-10-12 MED ORDER — MORPHINE SULFATE 4 MG/ML IJ SOLN
4.0000 mg | Freq: Once | INTRAMUSCULAR | Status: AC
  Administered 2014-10-12: 4 mg via INTRAVENOUS
  Filled 2014-10-12: qty 1

## 2014-10-12 MED ORDER — IPRATROPIUM-ALBUTEROL 0.5-2.5 (3) MG/3ML IN SOLN
3.0000 mL | Freq: Once | RESPIRATORY_TRACT | Status: AC
  Administered 2014-10-12: 3 mL via RESPIRATORY_TRACT
  Filled 2014-10-12: qty 3

## 2014-10-12 MED ORDER — ALBUTEROL SULFATE HFA 108 (90 BASE) MCG/ACT IN AERS: 1.0000 | INHALATION_SPRAY | Freq: Four times a day (QID) | RESPIRATORY_TRACT | Status: DC | PRN

## 2014-10-12 MED ORDER — OSELTAMIVIR PHOSPHATE 75 MG PO CAPS: 75.0000 mg | ORAL_CAPSULE | Freq: Two times a day (BID) | ORAL | Status: DC

## 2014-10-12 MED ORDER — OSELTAMIVIR PHOSPHATE 75 MG PO CAPS
75.0000 mg | ORAL_CAPSULE | Freq: Once | ORAL | Status: AC
  Administered 2014-10-12: 75 mg via ORAL
  Filled 2014-10-12: qty 1

## 2014-10-12 NOTE — Discharge Instructions (Signed)
Call for a follow up appointment with a Family or Primary Care Provider.  °Return if Symptoms worsen.   °Take medication as prescribed.  ° ° °Emergency Department Resource Guide °1) Find a Doctor and Pay Out of Pocket °Although you won't have to find out who is covered by your insurance plan, it is a good idea to ask around and get recommendations. You will then need to call the office and see if the doctor you have chosen will accept you as a new patient and what types of options they offer for patients who are self-pay. Some doctors offer discounts or will set up payment plans for their patients who do not have insurance, but you will need to ask so you aren't surprised when you get to your appointment. ° °2) Contact Your Local Health Department °Not all health departments have doctors that can see patients for sick visits, but many do, so it is worth a call to see if yours does. If you don't know where your local health department is, you can check in your phone book. The CDC also has a tool to help you locate your state's health department, and many state websites also have listings of all of their local health departments. ° °3) Find a Walk-in Clinic °If your illness is not likely to be very severe or complicated, you may want to try a walk in clinic. These are popping up all over the country in pharmacies, drugstores, and shopping centers. They're usually staffed by nurse practitioners or physician assistants that have been trained to treat common illnesses and complaints. They're usually fairly quick and inexpensive. However, if you have serious medical issues or chronic medical problems, these are probably not your best option. ° °No Primary Care Doctor: °- Call [facility] at  832-8000 - they can help you locate a primary care doctor that  accepts your insurance, provides certain services, etc. °- Physician Referral Service- 1-800-533-3463 ° °Chronic Pain Problems: °Organization         Address  Phone    Notes  °[facility]  (336) 297-2271 Patients need to be referred by their primary care doctor.  ° °Medication Assistance: °Organization         Address  Phone   Notes  °[place] [facility] [address] °[place], [place] [address] (336) 641-8030 --Must be a resident of [place] °-- Must have NO insurance coverage whatsoever (no Medicaid/ Medicare, etc.) °-- The pt. MUST have a primary care doctor that directs their care regularly and follows them in the community °  °[facility]  (866) 331-1348   °[employer]  (888) 892-1162   ° °Agencies that provide inexpensive medical care: °Organization         Address  Phone   Notes  °[facility]  (336) 832-8035   °[facility]    (336) 832-7272   °[facility] [address] °[place], [place] [address] (336) 832-4777   °[facility] [address], °[place] (336) 271-4999   °[facility]    (336) 373-0678   °[facility]    (336) 272-1050   °[facility] ° [address], [place] Phone:  (336) 832-4444, Fax:  (336) 832-4440 Hours of Operation:  9 am - 6 pm, M-F.  Also accepts Medicaid/Medicare and self-pay.  °[facility] ° [address], [place]   Phone: (336) 832-3150, Fax: (336) 832-3151. Hours of Operation:  8:30 am - 5:30 pm, M-F.  Also accepts Medicaid and self-pay.  °[facility] [place] [address], [place] Phone: (336) 878-6027   °[facility] [address], [place], [place] (336)723-1848, Ext. 123 Mondays & Thursdays: 7-9 AM.  First 15 patients are seen on a first come, first serve basis. °  ° °Medicaid-accepting [place] Providers: ° °Organization         Address  Phone   Notes  °[facility] [address], [place] (336) 641-2100 Also accepts self-pay patients.  °[facility]  [address], [place] ° (336) 856-9996   °[facility] [address], [place] (336) 288-8857   °[facility] [address] [place] [address], [place] (336) 299-7000   °Veita Bland [address], [place]  ° (336) 373-1557 Only accepts [place] Access Medicaid patients after they have their name applied to their card.  ° °Self-Pay (no insurance) in [place]: ° °Organization         Address  Phone   Notes  °Sickle Cell Patients, [facility] [address], [place] (336) 832-1970   °[facility] [address], [place] (336) 832-4400   °[facility] [employer] ° [address], [place] (336) 992-4800   °[facility]/Dr. Osei-Bonsu ° [address] [place] [address], [place] or [address], [place] (336) 841-8500 Phone number for both [place] and [place] locations is the same.  °[facility] [address], [place] (336) 299-0000   °[facility] [address] [place] [address], [place] or [address] (336) 852-7530 °(336) 878-2260   °[facility] [address], [place] (336) 350-1642, phone; (336) 294-5005, fax Sees patients 1st and 3rd Saturday of every month.  Must not qualify for public or private insurance (i.e. Medicaid, Medicare, [place] Health Choice, Veterans' Benefits) • Household income should be no more than 200% of the poverty level •The clinic cannot treat you if you are pregnant or think you are pregnant • Sexually transmitted diseases are not treated at the clinic.  ° ° °Dental Care: °Organization         Address  Phone  Notes  °[facility] [address], [place] (336) 641-6152 Accepts children up to age 21 who are enrolled in Medicaid or [place] Health Choice; pregnant women with a Medicaid card; and children who have  applied for Medicaid or [place] Health Choice, but were declined, whose parents can pay a reduced fee at time of service.  °[facility] [place]  [address], [place] (336) 641-7733 Accepts children up to age 21 who are enrolled in Medicaid or [place] Health Choice; pregnant women with a Medicaid card; and children who have applied for Medicaid or [place] Health Choice, but were declined, whose parents can pay a reduced fee at time of service.  °[facility] ° [address], [place] (336) 641-4533 Patients are seen by appointment only. Walk-ins are not accepted. [facility] will see patients 18 years of age and older. °Monday - Tuesday (8am-5pm) °Most Wednesdays (8:30-5pm) °$30 per visit, cash only  °[facility] ° [address], [place] (336) 641-4533 Patients are seen by appointment only. Walk-ins are not accepted. [facility] will see patients 18 years of age and older. °One Wednesday Evening (Monthly: Volunteer Based).  $30 per visit, cash only  °[facility]  (919) 537-3737 for adults; Children under age 4, call Graduate Pediatric Dentistry at (919) [phone]. Children aged 4-14, please call (919) [phone] to request a pediatric application. ° Dental services are provided in all areas of dental care including fillings, crowns and bridges, complete and partial dentures, implants, gum treatment, root canals, and extractions. Preventive care is also provided. Treatment is provided to both adults and children. °Patients are selected via a lottery and there is often a waiting list. °  °[facility] [address], °[place] ° (336) 763-8833 [email or web address] °  °[facility] [address], [place], [place] (336)723-1848, Ext. 123 Second and Fourth Thursday of each month, opens at 6:30 AM; Clinic ends at 9 AM.  Patients are seen on a first-come first-served basis, and a  limited number are seen during each clinic.  ° °[facility] ° [address], [place], [place] (336) 723-7904   Eligibility Requirements °You must have lived in [place], [place], or [place] counties for at least the last three months. °  You cannot be eligible for state or federal sponsored healthcare insurance, including Veterans Administration, Medicaid, or Medicare. °  You generally cannot be eligible for healthcare insurance through your employer.  °  How to apply: °Eligibility screenings are held every Tuesday and Wednesday afternoon from 1:00 pm until 4:00 pm. You do not need an appointment for the interview!  °[facility] [address], [place], [place] 336-631-2330   °[place] [facility]  336-342-8273   °[facility]  336-703-3100   ° [facility]  336-570-6415   ° °Behavioral Health Resources in the Community: °Intensive Outpatient Programs °Organization         Address  Phone  Notes  °[facility] [address], [place], [place] 336-878-6098   °[facility] [address], [place], [place] 336-832-9800   °[facility] [address], [place], [place] ° 336-882-2125   °[facility] [address],  °[place], [place] 1-800-853-5163 or 336-641-4981   °Substance Abuse Resources °Organization         Address  Phone  Notes  °[facility]  336-882-2125   °[facility]  336-784-9470   °[facility]  336-285-9073   °[facility]  336-845-3988   °Residential & Outpatient Substance Abuse Program  1-800-659-3381   °Psychological Services °Organization         Address  Phone  Notes  °[facility]  336- 832-9600   °[facility]  336- 378-7881   °[facility] [address], [place] 1-800-853-5163 or 336-641-4981   ° °Mobile Crisis Teams °Organization          Address  Phone  Notes  °[facility], Mobile Crisis Care Unit  1-877-626-1772   °Assertive °[facility] ° [address] [place], [place] 336-834-9664   °Sharon DeEsch [address] °[place] 336-554-5454   ° °Self-Help/Support Groups °Organization         Address  Phone             Notes  °[facility] - variety of   support groups  336- 373-1402 Call for more information  °Narcotics Anonymous (NA), [facility] [address], °[place]  2 meetings at this location  ° °Residential Treatment Programs °Organization         Address  Phone  Notes  °[facility] [address],    °[place]  1-866-801-8205   °[facility] ° [address], [place], [place] 704-293-8524   °[facility] [address], [place] 336-845-3988 Admissions: 8am-3pm M-F  °[facility] [address]    °[place], [place] 336-841-1104   °[facility] [address], [place], [place] 336-379-7146   °[facility] [address]  °[place], [place] 336-285-9073   °[facility] - Intensive Outpatient [address], [place], [place] 336-852-3033   °[facility] [address]  °[place], [place] 1-877-615-2722 or 336-784-9470   °[facility] [address] [place], [place] 336-227-7417 Accepts Medicaid  °[facility] [address]  °[place] 1-800-659-3381 Substance Abuse/Addiction Treatment  ° °[place] Behavioral Health Resources °Organization         Address  Phone  Notes  °[facility]  (888) 581-9988   °Julie Brannon, PhD [address] [place], [place]   (336) 349-5553 or (336) 951-0000   °[facility]   [address] °[place], [place] (336) 349-4454   °[facility] [address], [place], [place] (336) 342-8316 Insurance/Medicaid/sponsorship  through [facility]  °[facility] [address]                                    [place], [place] (336) 342-8316 Therapy/tele-psych/case  °[facility] [address]  ° [place], [place] (336) 349-2233    °Dr. Arfeen  (336) 349-4544   °[facility] [place]  [employer] [place] [facility] 1) [address], [place] °2) [address], [place] °3)  [address], [place] (336) 349-3220 °(336) 342-7768 ° °(336) 342-8140   °[place] [facility] (336) 342-1394 or (336) 342-3537 (After Hours)    ° °
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
## 2014-10-12 NOTE — ED Notes (Signed)
EDPA NICHOLE at bedside. 

## 2014-10-12 NOTE — ED Provider Notes (Signed)
Patient care assumed by P[place], PA-C at shift change. Patient with dyspnea, cough. Likely asthma or viral illness. Patient's SPO2 95% on room air and tachycardic at 117. Elevated D-dimer awaiting CTA results. CTA negative plan to discharge home with Tamiflu, prednisone, albuterol and cough medication follow-up with his PCP.   Results for orders placed or performed during the hospital encounter of 10/12/14  CBC with Differential  Result Value Ref Range   WBC 5.7 4.0 - 10.5 K/uL   RBC 4.78 4.22 - 5.81 MIL/uL   Hemoglobin 15.8 13.0 - 17.0 g/dL   HCT 95.6 21.3 - 08.6 %   MCV 95.2 78.0 - 100.0 fL   MCH 33.1 26.0 - 34.0 pg   MCHC 34.7 30.0 - 36.0 g/dL   RDW 57.8 46.9 - 62.9 %   Platelets 306 150 - 400 K/uL   Neutrophils Relative % 66 43 - 77 %   Neutro Abs 3.8 1.7 - 7.7 K/uL   Lymphocytes Relative 16 12 - 46 %   Lymphs Abs 0.9 0.7 - 4.0 K/uL   Monocytes Relative 13 (H) 3 - 12 %   Monocytes Absolute 0.7 0.1 - 1.0 K/uL   Eosinophils Relative 4 0 - 5 %   Eosinophils Absolute 0.2 0.0 - 0.7 K/uL   Basophils Relative 1 0 - 1 %   Basophils Absolute 0.0 0.0 - 0.1 K/uL  Basic metabolic panel  Result Value Ref Range   Sodium 140 135 - 145 mmol/L   Potassium 3.5 3.5 - 5.1 mmol/L   Chloride 109 96 - 112 mmol/L   CO2 24 19 - 32 mmol/L   Glucose, Bld 102 (H) 70 - 99 mg/dL   BUN 8 6 - 23 mg/dL   Creatinine, Ser 5.28 0.50 - 1.35 mg/dL   Calcium 9.2 8.4 - 41.3 mg/dL   GFR calc non Af Amer >90 >90 mL/min   GFR calc Af Amer >90 >90 mL/min   Anion gap 7 5 - 15  D-dimer, quantitative  Result Value Ref Range   D-Dimer, Quant 0.63 (H) 0.00 - 0.48 ug/mL-FEU   Dg Chest 2 View  10/12/2014   CLINICAL DATA:  Acute chest tightness with shortness of breath and cough. History of asthma.  EXAM: CHEST  2 VIEW  COMPARISON:  10/01/2009  FINDINGS: Chronic central airway thickening and mild hyperinflation. No focal pneumonia, collapse consolidation. Normal heart size and vascularity. Negative for edema,  effusion or pneumothorax. Trachea is midline. No acute osseous finding.  IMPRESSION: Chronic central airway thickening and slight hyperinflation. no focal pneumonia.   Electronically Signed   By: Judie Petit.  Shick M.D.   On: 10/12/2014 14:19   Dg Ankle Complete Right  10/07/2014   CLINICAL DATA:  Pain right calcaneal region.  Recent rolling injury  EXAM: RIGHT ANKLE - COMPLETE 3+ VIEW  COMPARISON:  June 30, 2014  FINDINGS: Frontal, oblique, and lateral views were obtained. There is no fracture or effusion. The ankle mortise appears intact. There is no abnormal calcification in the region of the Achilles tendon.  IMPRESSION: No focal lesion.  No appreciable change from prior study.   Electronically Signed   By: Bretta Bang III M.D.   On: 10/07/2014 10:45   Ct Angio Chest Pe W/cm &/or Wo Cm  10/12/2014   CLINICAL DATA:  Cough, congestion, chest pain and shortness of breath.  EXAM: CT ANGIOGRAPHY CHEST WITH CONTRAST  TECHNIQUE: Multidetector CT imaging of the chest was performed using the standard protocol during bolus administration of  intravenous contrast. Multiplanar CT image reconstructions and MIPs were obtained to evaluate the vascular anatomy.  CONTRAST:  100mL OMNIPAQUE IOHEXOL 350 MG/ML SOLN  COMPARISON:  Chest x-ray earlier today.  FINDINGS: The pulmonary arteries are well opacified. There is no evidence of pulmonary embolism. The thoracic aorta is also adequately opacified and shows no evidence of aneurysmal disease or dissection. Proximal great vessels show bovine branching anatomy and normal patency.  The heart size is normal. No pleural or pericardial fluid is identified. Lungs show minimal suggestion bronchial thickening in the lower lung zones bilaterally. No evidence of focal infiltrate, consolidation, edema, pneumothorax or nodule.  No lymphadenopathy is identified. No evidence of airway obstruction or bronchiectasis. Visualized upper abdominal structures are within normal limits. The bony  thorax is unremarkable.  Review of the MIP images confirms the above findings.  IMPRESSION: No evidence of pulmonary embolism. Suggestion of minimal bronchial thickening in both lower lung zones.   Electronically Signed   By: Irish LackGlenn  Yamagata M.D.   On: 10/12/2014 16:23    Mellody DrownLauren Madigan Rosen[employer]el, PA-C 10/12/14 2034  Gilda Creasehristopher J. Pollina, MD 10/12/14 [phone]

## 2014-10-12 NOTE — ED Notes (Addendum)
Pt reports nonproductive cough x3 , hx of asthma as a child. Chest and back pain from SOB. 8/10 pain. Pt reports he is out of inhaler. Reports wheezing and SOB. Denies vomiting. Diarrhea present.

## 2014-10-12 NOTE — ED Notes (Signed)
Patient transported to CT 

## 2014-11-16 ENCOUNTER — Encounter (HOSPITAL_COMMUNITY): Payer: Self-pay | Admitting: Emergency Medicine

## 2014-11-16 ENCOUNTER — Emergency Department (HOSPITAL_COMMUNITY): Payer: No Typology Code available for payment source

## 2014-11-16 ENCOUNTER — Emergency Department (HOSPITAL_COMMUNITY)
Admission: EM | Admit: 2014-11-16 | Discharge: 2014-11-17 | Disposition: A | Discharge status: Home / Self Care | Payer: No Typology Code available for payment source | Attending: Emergency Medicine | Admitting: Emergency Medicine

## 2014-11-16 DIAGNOSIS — Z7952 Long term (current) use of systemic steroids: Secondary | ICD-10-CM | POA: Insufficient documentation

## 2014-11-16 DIAGNOSIS — R0602 Shortness of breath: Secondary | ICD-10-CM

## 2014-11-16 DIAGNOSIS — Z79899 Other long term (current) drug therapy: Secondary | ICD-10-CM | POA: Insufficient documentation

## 2014-11-16 DIAGNOSIS — Z872 Personal history of diseases of the skin and subcutaneous tissue: Secondary | ICD-10-CM | POA: Insufficient documentation

## 2014-11-16 DIAGNOSIS — Z87828 Personal history of other (healed) physical injury and trauma: Secondary | ICD-10-CM | POA: Insufficient documentation

## 2014-11-16 DIAGNOSIS — J45901 Unspecified asthma with (acute) exacerbation: Secondary | ICD-10-CM | POA: Insufficient documentation

## 2014-11-16 DIAGNOSIS — Z720 Tobacco use: Secondary | ICD-10-CM | POA: Insufficient documentation

## 2014-11-16 DIAGNOSIS — Z8739 Personal history of other diseases of the musculoskeletal system and connective tissue: Secondary | ICD-10-CM | POA: Insufficient documentation

## 2014-11-16 MED ORDER — KETOROLAC TROMETHAMINE 30 MG/ML IJ SOLN
30.0000 mg | Freq: Once | INTRAMUSCULAR | Status: AC
  Administered 2014-11-16: 30 mg via INTRAMUSCULAR
  Filled 2014-11-16: qty 1

## 2014-11-16 MED ORDER — IPRATROPIUM-ALBUTEROL 0.5-2.5 (3) MG/3ML IN SOLN
3.0000 mL | Freq: Once | RESPIRATORY_TRACT | Status: AC
  Administered 2014-11-16: 3 mL via RESPIRATORY_TRACT
  Filled 2014-11-16: qty 3

## 2014-11-16 NOTE — ED Notes (Signed)
Pt states that he has a hx of asthma and has been treated for a viral illness recently but has not been able to breathe right since then. Alert and oriented. Chest tightness.
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
## 2014-11-17 MED ORDER — ALBUTEROL SULFATE HFA 108 (90 BASE) MCG/ACT IN AERS: 2.0000 | INHALATION_SPRAY | Freq: Four times a day (QID) | RESPIRATORY_TRACT | Status: DC | PRN

## 2014-11-17 MED ORDER — ALBUTEROL SULFATE HFA 108 (90 BASE) MCG/ACT IN AERS
INHALATION_SPRAY | RESPIRATORY_TRACT | Status: AC
  Filled 2014-11-17: qty 6.7

## 2014-11-17 MED ORDER — ALBUTEROL SULFATE HFA 108 (90 BASE) MCG/ACT IN AERS
2.0000 | INHALATION_SPRAY | RESPIRATORY_TRACT | Status: DC | PRN
  Administered 2014-11-17: 2 via RESPIRATORY_TRACT

## 2014-11-17 MED ORDER — KETOROLAC TROMETHAMINE 10 MG PO TABS: 10.0000 mg | ORAL_TABLET | Freq: Four times a day (QID) | ORAL | Status: DC | PRN

## 2014-11-17 NOTE — Discharge Instructions (Signed)
Asthma °Asthma is a condition of the lungs in which the airways tighten and narrow. Asthma can make it hard to breathe. Asthma cannot be cured, but medicine and lifestyle changes can help control it. Asthma may be started (triggered) by: °· Animal skin flakes (dander). °· Dust. °· Cockroaches. °· Pollen. °· Mold. °· Smoke. °· Cleaning products. °· Hair sprays or aerosol sprays. °· Paint fumes or strong smells. °· Cold air, weather changes, and winds. °· Crying or laughing hard. °· Stress. °· Certain medicines or drugs. °· Foods, such as dried fruit, potato chips, and sparkling grape juice. °· Infections or conditions (colds, flu). °· Exercise. °· Certain medical conditions or diseases. °· Exercise or tiring activities. °HOME CARE  °· Take medicine as told by your doctor. °· Use a peak flow meter as told by your doctor. A peak flow meter is a tool that measures how well the lungs are working. °· Record and keep track of the peak flow meter's readings. °· Understand and use the asthma action plan. An asthma action plan is a written plan for taking care of your asthma and treating your attacks. °· To help prevent asthma attacks: °¨ Do not smoke. Stay away from secondhand smoke. °¨ Change your heating and air conditioning filter often. °¨ Limit your use of fireplaces and wood stoves. °¨ Get rid of pests (such as roaches and mice) and their droppings. °¨ Throw away plants if you see mold on them. °¨ Clean your floors. Dust regularly. Use cleaning products that do not smell. °¨ Have someone vacuum when you are not home. Use a vacuum cleaner with a HEPA filter if possible. °¨ Replace carpet with wood, tile, or vinyl flooring. Carpet can trap animal skin flakes and dust. °¨ Use allergy-proof pillows, mattress covers, and box spring covers. °¨ Wash bed sheets and blankets every week in hot water and dry them in a dryer. °¨ Use blankets that are made of polyester or cotton. °¨ Clean bathrooms and kitchens with bleach. If  possible, have someone repaint the walls in these rooms with mold-resistant paint. Keep out of the rooms that are being cleaned and painted. °¨ Wash hands often. °GET HELP IF: °· You have make a whistling sound when breaking (wheeze), have shortness of breath, or have a cough even if taking medicine to prevent attacks. °· The colored mucus you cough up (sputum) is thicker than usual. °· The colored mucus you cough up changes from clear or white to yellow, green, gray, or bloody. °· You have problems from the medicine you are taking such as: °¨ A rash. °¨ Itching. °¨ Swelling. °¨ Trouble breathing. °· You need reliever medicines more than 2-3 times a week. °· Your peak flow measurement is still at 50-79% of your personal best after following the action plan for 1 hour. °· You have a fever. °GET HELP RIGHT AWAY IF:  °· You seem to be worse and are not responding to medicine during an asthma attack. °· You are short of breath even at rest. °· You get short of breath when doing very little activity. °· You have trouble eating, drinking, or talking. °· You have chest pain. °· You have a fast heartbeat. °· Your lips or fingernails start to turn blue. °· You are light-headed, dizzy, or faint. °· Your peak flow is less than 50% of your personal best. °MAKE SURE YOU:  °· Understand these instructions. °· Will watch your condition. °· Will get help right away if you   are not doing well or get worse. Document Released: 01/30/2008 Document Revised: 12/28/2013 Document Reviewed: 03/12/2013 [facility] Patient Information 2015 [place], [place]. This information is not intended to replace advice given to you by your health care provider. Make sure you discuss any questions you have with your health care provider.  Emergency Department Resource Guide 1) Find a Doctor and Pay Out of Pocket Although you won't have to find out who is covered by your insurance plan, it is a good idea to ask around and get recommendations. You will  then need to call the office and see if the doctor you have chosen will accept you as a new patient and what types of options they offer for patients who are self-pay. Some doctors offer discounts or will set up payment plans for their patients who do not have insurance, but you will need to ask so you aren't surprised when you get to your appointment.  2) Contact Your Local Health Department Not all health departments have doctors that can see patients for sick visits, but many do, so it is worth a call to see if yours does. If you don't know where your local health department is, you can check in your phone book. The CDC also has a tool to help you locate your state's health department, and many state websites also have listings of all of their local health departments.  3) Find a [facility] If your illness is not likely to be very severe or complicated, you may want to try a walk in clinic. These are popping up all over the country in pharmacies, drugstores, and shopping centers. They're usually staffed by nurse practitioners or physician assistants that have been trained to treat common illnesses and complaints. They're usually fairly quick and inexpensive. However, if you have serious medical issues or chronic medical problems, these are probably not your best option.  No Primary Care Doctor: - Call Health Connect at  [phone] - they can help you locate a primary care doctor that  accepts your insurance, provides certain services, etc. - Physician Referral Service- [phone]  Chronic Pain Problems: Organization         Address  Phone   Notes  [facility]  [phone] Patients need to be referred by their primary care doctor.   Medication Assistance: Organization         Address  Phone   Notes  [facility] Med[facility] [address] [place], [place] [address] [phone] --Must be a resident of [facility] -- Must have NO  insurance coverage whatsoever (no Medicaid/ Medicare, etc.) -- The pt. MUST have a primary care doctor that directs their care regularly and follows them in the community   [facility]  [phone]   [employer]  [phone]    Agencies that provide inexpensive medical care: Organization         Address  Phone   Notes  [place]  [phone]   [facility]    [phone]   [facility] [place], [place] [address] [phone]   [place] [address], [place] [phone]   [facility]    [phone]   [facility]    [phone]   [place] [address], [place] Phone:  [phone], Fax:  [phone] Hours of Operation:  9 am - 6 pm, M-F.  Also accepts Medicaid/Medicare and self-pay.  °[facility] ° [address], [place] Phone: [phone], Fax: [phone]. Hours of Operation:  8:30 am - 5:30 pm, M-F.  Also accepts Medicaid and self-pay.  °[facility] [place] [address], [place] Phone: ([phone]) [phone]   °[facility] [address], [place], [place] ([phone], Ext. 123 Mondays & Thursdays: 7-9 AM.  First 15 patients are seen on a first come, first serve basis. °  ° °Medicaid-accepting [place] Providers: ° °Organization         Address  Phone   Notes  °[facility] [address], [place] ([phone]) [phone] Also accepts self-pay patients.  °[facility] [address], [place] ° (336) [phone]   °[facility] [address], [place] ([phone]) [phone]   °[facility] [address] [place] [address], [place] (336) [phone]   °Veita Bland [address], [place]  ° ([phone]) [phone] Only accepts [place] Access Medicaid patients after they have  their name applied to their card.  ° °Self-Pay (no insurance) in [place]: ° °Organization         Address  Phone   Notes  °Sickle Cell Patients, [facility] [address], [place] ([phone]) [phone]   °[facility] [address], [place] (336) [phone]   °[facility] ° [address], [place] ([phone]) [phone]   °[facility]/Dr. Osei-Bonsu ° [address] [place] [address], [place] or [address], [place] [phone]) [phone] Phone number for both [place] and [place] locations is the same.  °[facility] [address], [place] ([phone]) [phone]   °[facility] [address] [place] [address], [place] or [address] (336) [phone] °(336) [phone]   °[facility] [address], [place] (336) [phone], phone; ([phone]) [phone], fax Sees patients 1st and 3rd Saturday of every month.  Must not qualify for public or private insurance (i.e. Medicaid, Medicare, [place] Health Choice, Veterans' Benefits) • Household income should be no more than 200% of the poverty level •The clinic cannot treat you if you are pregnant or think you are pregnant • Sexually transmitted diseases are not treated at the clinic.  ° ° °Dental Care: °Organization         Address  Phone  Notes  °[place] [facility] [address], [place] (336) [phone] Accepts children up to age 21 who are enrolled in Medicaid or [place] Health Choice; pregnant women with a Medicaid card; and children who have applied for Medicaid or [place] Health Choice, but were declined, whose parents can pay a reduced fee at time of service.  °[place] [facility] [place]  [address], [place] (336) 641-7733 Accepts children up to age 21 who are enrolled in Medicaid or [place] Health Choice; pregnant women with a Medicaid card; and children who have applied  for Medicaid or  Health Choice, but were declined, whose parents can pay a reduced fee at time of service.  °[facility] ° [address], [place] ([phone]) [phone] Patients are seen by appointment only. Walk-ins are not accepted. [facility] will see patients 18 years   of age and older. °Monday - Tuesday (8am-5pm) °Most Wednesdays (8:30-5pm) °$30 per visit, cash only  °[facility] ° [address], [place] ([phone]) [phone] Patients are seen by appointment only. Walk-ins are not accepted. [facility] will see patients 18 years of age and older. °One Wednesday Evening (Monthly: Volunteer Based).  $30 per visit, cash only  °[facility]  ([phone]) [phone] for adults; Children under age 4, call Graduate Pediatric Dentistry at [phone]. Children aged 4-14, please call [phone] to request a pediatric application. ° Dental services are provided in all areas of dental care including fillings, crowns and bridges, complete and partial dentures, implants, gum treatment, root canals, and extractions. Preventive care is also provided. Treatment is provided to both adults and children. °Patients are selected via a lottery and there is often a waiting list. °  °[facility] [address], °[place] ° (336) 763-8833 [email or web address] °  °[facility] [address], [place], [place] (336)723-1848, Ext. 123 Second and Fourth Thursday of each month, opens at 6:30 AM; Clinic ends at 9 AM.  Patients are seen on a first-come first-served basis, and a limited number are seen during each clinic.  ° °[facility] ° [address], [place], [place] ([phone]) [phone]   Eligibility Requirements °You must have lived in [place], [place], or [place] counties for at least the last three months. °  You cannot be eligible for state or federal sponsored healthcare insurance, including Veterans Administration,  Medicaid, or Medicare. °  You generally cannot be eligible for healthcare insurance through your employer.  °  How to apply: °Eligibility screenings are held every Tuesday and Wednesday afternoon from 1:00 pm until 4:00 pm. You do not need an appointment for the interview!  °[facility] [address], [place], [place] [phone]   °[facility]  336-342-8273   °[place] [facility]  336-703-3100   °[facility]  336-570-6415   ° °Behavioral Health Resources in the Community: °Intensive Outpatient Programs °Organization         Address  Phone  Notes  °[facility] [address], [place], [place] [phone]   °[facility] [address], [place], [place] [phone]   °[facility] [address], [place], [place] ° 336-882-2125   °[place] [address],  °[place], [place] 1-800-853-5163 or 336-641-4981   °Substance Abuse Resources °Organization         Address  Phone  Notes  °[facility]  336-882-2125   °[facility]  336-784-9470   °[facility]  336-285-9073   °[facility]  336-845-3988   °Residential & Outpatient Substance Abuse Program  1-800-659-3381   °Psychological Services °Organization         Address  Phone  Notes  °[facility]  336- 832-9600   °[facility]  336- 378-7881   °[facility] [address], [place] 1-800-853-5163 or 336-641-4981   ° °Mobile Crisis Teams °Organization         Address  Phone  Notes  °[facility], Mobile Crisis Care Unit  1-877-626-1772   °Assertive °[facility] ° [address] [place], [place] 336-834-9664   °Sharon DeEsch [address] °  336-554-5454   ° °Self-Help/Support Groups °Organization           Address  Phone             Notes  [facility] [place] - variety of support  groups  336- [phone] [phone] Call for more information  Narcotics Anonymous (NA), [facility] [address], [place]  2 meetings at this location   [facility] Organization         Address  Phone  Notes  [facility] [address],    [place]  [phone]   [facility]  [address], [place] [phone], [place], [place] [phone]   [facility] [address], [place] [phone] Admissions: 8am-3pm M-F  [facility] [address]    [place], [place] [phone]   [facility] [address], [place], [place] [phone]   [facility] [address]  [place], [place] [phone]   [facility] Intensive Outpatient [address], [place], [place] [phone]   [facility] [address]  [place], [place] [phone] or [phone]   [facility] ([facility]) [address] [place], [place] [phone] Accepts Medicaid  [facility] [address]  [place] [phone] Substance Abuse/Addiction Treatment   [facility] Resources Organization         Address  Phone  Notes  [facility]  [phone](888) [phone]   Angie FavaJulie Brannon, PhD [address], [address] [place], [place]   [phone](336) [phone] or [phone](336) [phone]   [facility] [place]   [address] [place], [place] [phone](336) [phone]   [facility] [address], [place], [place] [phone](336) [phone] Insurance/Medicaid/sponsorship through [facility]  [facility] [address] [place] [address]                                    [place], [place] [phone](336) [phone] Therapy/tele-psych/case  [facility] [address]   [place], [place] [phone](336) [phone]    Dr. Lolly MustacheArfeen  [phone]) [phone]   [facility] [place]  [employer] [facility] 1) [address], [place] 2) [address], [place] 3)   [address], [place] [phone](336) [phone](336) [phone]  [phone]   [facility] [place] Child Abuse Hotline [phone](336) [phone] or [phone](336) [phone] (After Hours)    It is very important that you find a primary care physician for regular medical care and management of your asthma

## 2014-11-17 NOTE — ED Notes (Signed)
Pt ambulated around the department and pulsox rechecked upon return to room. Oxygen 100%, pulse 90, lung sounds clear.

## 2015-05-13 ENCOUNTER — Encounter (HOSPITAL_COMMUNITY): Payer: Self-pay | Admitting: Emergency Medicine

## 2015-05-13 ENCOUNTER — Emergency Department (HOSPITAL_COMMUNITY)
Admission: EM | Admit: 2015-05-13 | Discharge: 2015-05-13 | Disposition: A | Discharge status: Home / Self Care | Payer: Self-pay | Attending: Emergency Medicine | Admitting: Emergency Medicine

## 2015-05-13 ENCOUNTER — Emergency Department (HOSPITAL_COMMUNITY): Payer: No Typology Code available for payment source

## 2015-05-13 DIAGNOSIS — J209 Acute bronchitis, unspecified: Secondary | ICD-10-CM | POA: Insufficient documentation

## 2015-05-13 DIAGNOSIS — J40 Bronchitis, not specified as acute or chronic: Secondary | ICD-10-CM

## 2015-05-13 DIAGNOSIS — Z87891 Personal history of nicotine dependence: Secondary | ICD-10-CM | POA: Insufficient documentation

## 2015-05-13 DIAGNOSIS — J45909 Unspecified asthma, uncomplicated: Secondary | ICD-10-CM | POA: Insufficient documentation

## 2015-05-13 DIAGNOSIS — Z8739 Personal history of other diseases of the musculoskeletal system and connective tissue: Secondary | ICD-10-CM | POA: Insufficient documentation

## 2015-05-13 DIAGNOSIS — Z872 Personal history of diseases of the skin and subcutaneous tissue: Secondary | ICD-10-CM | POA: Insufficient documentation

## 2015-05-13 DIAGNOSIS — Z79899 Other long term (current) drug therapy: Secondary | ICD-10-CM | POA: Insufficient documentation

## 2015-05-13 DIAGNOSIS — Z87828 Personal history of other (healed) physical injury and trauma: Secondary | ICD-10-CM | POA: Insufficient documentation

## 2015-05-13 HISTORY — DX: Cluster headache syndrome, unspecified, not intractable: G44.009

## 2015-05-13 HISTORY — DX: Foot drop, right foot: M21.371

## 2015-05-13 MED ORDER — ALBUTEROL SULFATE HFA 108 (90 BASE) MCG/ACT IN AERS: 1.0000 | INHALATION_SPRAY | Freq: Four times a day (QID) | RESPIRATORY_TRACT | Status: DC | PRN

## 2015-05-13 MED ORDER — ALBUTEROL SULFATE (2.5 MG/3ML) 0.083% IN NEBU
5.0000 mg | INHALATION_SOLUTION | Freq: Once | RESPIRATORY_TRACT | Status: AC
  Administered 2015-05-13: 5 mg via RESPIRATORY_TRACT
  Filled 2015-05-13: qty 6

## 2015-05-13 MED ORDER — AZITHROMYCIN 250 MG PO TABS: 250.0000 mg | ORAL_TABLET | Freq: Every day | ORAL | Status: DC

## 2015-05-13 MED ORDER — ALBUTEROL SULFATE HFA 108 (90 BASE) MCG/ACT IN AERS
2.0000 | INHALATION_SPRAY | RESPIRATORY_TRACT | Status: DC
  Administered 2015-05-13: 2 via RESPIRATORY_TRACT
  Filled 2015-05-13: qty 6.7

## 2015-05-13 MED ORDER — PREDNISONE 20 MG PO TABS
60.0000 mg | ORAL_TABLET | Freq: Every day | ORAL | Status: DC
Start: 2015-05-14 — End: 2015-05-13
  Administered 2015-05-13: 60 mg via ORAL
  Filled 2015-05-13: qty 3

## 2015-05-13 MED ORDER — PREDNISONE 10 MG PO TABS: ORAL_TABLET | ORAL | Status: DC

## 2015-05-13 NOTE — ED Notes (Signed)
Pt reports he had some SOB and non productive  Cough that has been getting worse since yesterday.  States he took some mucinex but still has a non productive cough.  Pt report headache and ache joints for past couple of days.

## 2015-05-13 NOTE — ED Notes (Signed)
Patient c/o progression of asthma symptoms, states he feels SOB, has been using his home inhaler without relief. Patient states he also has a migraine that he has taken excedrin migraine for without relief. Patient also c/o generalized joint pain, "feeling achy". SPO2 99% RA.
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
## 2015-05-13 NOTE — Discharge Instructions (Signed)
Acute Bronchitis Bronchitis is inflammation of the airways that extend from the windpipe into the lungs (bronchi). The inflammation often causes mucus to develop. This leads to a cough, which is the most common symptom of bronchitis.  In acute bronchitis, the condition usually develops suddenly and goes away over time, usually in a couple weeks. Smoking, allergies, and asthma can make bronchitis worse. Repeated episodes of bronchitis may cause further lung problems.  CAUSES Acute bronchitis is most often caused by the same virus that causes a cold. The virus can spread from person to person (contagious) through coughing, sneezing, and touching contaminated objects. SIGNS AND SYMPTOMS   Cough.   Fever.   Coughing up mucus.   Body aches.   Chest congestion.   Chills.   Shortness of breath.   Sore throat.  DIAGNOSIS  Acute bronchitis is usually diagnosed through a physical exam. Your health care provider will also ask you questions about your medical history. Tests, such as chest X-rays, are sometimes done to rule out other conditions.  TREATMENT  Acute bronchitis usually goes away in a couple weeks. Oftentimes, no medical treatment is necessary. Medicines are sometimes given for relief of fever or cough. Antibiotic medicines are usually not needed but may be prescribed in certain situations. In some cases, an inhaler may be recommended to help reduce shortness of breath and control the cough. A cool mist vaporizer may also be used to help thin bronchial secretions and make it easier to clear the chest.  HOME CARE INSTRUCTIONS  Get plenty of rest.   Drink enough fluids to keep your urine clear or pale yellow (unless you have a medical condition that requires fluid restriction). Increasing fluids may help thin your respiratory secretions (sputum) and reduce chest congestion, and it will prevent dehydration.   Take medicines only as directed by your health care provider.  If  you were prescribed an antibiotic medicine, finish it all even if you start to feel better.  Avoid smoking and secondhand smoke. Exposure to cigarette smoke or irritating chemicals will make bronchitis worse. If you are a smoker, consider using nicotine gum or skin patches to help control withdrawal symptoms. Quitting smoking will help your lungs heal faster.   Reduce the chances of another bout of acute bronchitis by washing your hands frequently, avoiding people with cold symptoms, and trying not to touch your hands to your mouth, nose, or eyes.   Keep all follow-up visits as directed by your health care provider.  SEEK MEDICAL CARE IF: Your symptoms do not improve after 1 week of treatment.  SEEK IMMEDIATE MEDICAL CARE IF:  You develop an increased fever or chills.   You have chest pain.   You have severe shortness of breath.  You have bloody sputum.   You develop dehydration.  You faint or repeatedly feel like you are going to pass out.  You develop repeated vomiting.  You develop a severe headache. MAKE SURE YOU:   Understand these instructions.  Will watch your condition.  Will get help right away if you are not doing well or get worse. Document Released: 09/20/2004 Document Revised: 12/28/2013 Document Reviewed: 02/03/2013 [facility] Patient Information 2015 [place], [place]. This information is not intended to replace advice given to you by your health care provider. Make sure you discuss any questions you have with your health care provider. Asthma Asthma is a recurring condition in which the airways tighten and narrow. Asthma can make it difficult to breathe. It can cause coughing,  wheezing, and shortness of breath. Asthma episodes, also called asthma attacks, range from minor to life-threatening. Asthma cannot be cured, but medicines and lifestyle changes can help control it. CAUSES Asthma is believed to be caused by inherited (genetic) and environmental factors,  but its exact cause is unknown. Asthma may be triggered by allergens, lung infections, or irritants in the air. Asthma triggers are different for each person. Common triggers include:   Animal dander.  Dust mites.  Cockroaches.  Pollen from trees or grass.  Mold.  Smoke.  Air pollutants such as dust, household cleaners, hair sprays, aerosol sprays, paint fumes, strong chemicals, or strong odors.  Cold air, weather changes, and winds (which increase molds and pollens in the air).  Strong emotional expressions such as crying or laughing hard.  Stress.  Certain medicines (such as aspirin) or types of drugs (such as beta-blockers).  Sulfites in foods and drinks. Foods and drinks that may contain sulfites include dried fruit, potato chips, and sparkling grape juice.  Infections or inflammatory conditions such as the flu, a cold, or an inflammation of the nasal membranes (rhinitis).  Gastroesophageal reflux disease (GERD).  Exercise or strenuous activity. SYMPTOMS Symptoms may occur immediately after asthma is triggered or many hours later. Symptoms include:  Wheezing.  Excessive nighttime or early morning coughing.  Frequent or severe coughing with a common cold.  Chest tightness.  Shortness of breath. DIAGNOSIS  The diagnosis of asthma is made by a review of your medical history and a physical exam. Tests may also be performed. These may include:  Lung function studies. These tests show how much air you breathe in and out.  Allergy tests.  Imaging tests such as X-rays. TREATMENT  Asthma cannot be cured, but it can usually be controlled. Treatment involves identifying and avoiding your asthma triggers. It also involves medicines. There are 2 classes of medicine used for asthma treatment:   Controller medicines. These prevent asthma symptoms from occurring. They are usually taken every day.  Reliever or rescue medicines. These quickly relieve asthma symptoms. They  are used as needed and provide short-term relief. Your health care provider will help you create an asthma action plan. An asthma action plan is a written plan for managing and treating your asthma attacks. It includes a list of your asthma triggers and how they may be avoided. It also includes information on when medicines should be taken and when their dosage should be changed. An action plan may also involve the use of a device called a peak flow meter. A peak flow meter measures how well the lungs are working. It helps you monitor your condition. HOME CARE INSTRUCTIONS   Take medicines only as directed by your health care provider. Speak with your health care provider if you have questions about how or when to take the medicines.  Use a peak flow meter as directed by your health care provider. Record and keep track of readings.  Understand and use the action plan to help minimize or stop an asthma attack without needing to seek medical care.  Control your home environment in the following ways to help prevent asthma attacks:  Do not smoke. Avoid being exposed to secondhand smoke.  Change your heating and air conditioning filter regularly.  Limit your use of fireplaces and wood stoves.  Get rid of pests (such as roaches and mice) and their droppings.  Throw away plants if you see mold on them.  Clean your floors and dust regularly. Use unscented  cleaning products.  Try to have someone else vacuum for you regularly. Stay out of rooms while they are being vacuumed and for a short while afterward. If you vacuum, use a dust mask from a hardware store, a double-layered or microfilter vacuum cleaner bag, or a vacuum cleaner with a HEPA filter.  Replace carpet with wood, tile, or vinyl flooring. Carpet can trap dander and dust.  Use allergy-proof pillows, mattress covers, and box spring covers.  Wash bed sheets and blankets every week in hot water and dry them in a dryer.  Use blankets  that are made of polyester or cotton.  Clean bathrooms and kitchens with bleach. If possible, have someone repaint the walls in these rooms with mold-resistant paint. Keep out of the rooms that are being cleaned and painted.  Wash hands frequently. SEEK MEDICAL CARE IF:   You have wheezing, shortness of breath, or a cough even if taking medicine to prevent attacks.  The colored mucus you cough up (sputum) is thicker than usual.  Your sputum changes from clear or white to yellow, green, gray, or bloody.  You have any problems that may be related to the medicines you are taking (such as a rash, itching, swelling, or trouble breathing).  You are using a reliever medicine more than 2-3 times per week.  Your peak flow is still at 50-79% of your personal best after following your action plan for 1 hour.  You have a fever. SEEK IMMEDIATE MEDICAL CARE IF:   You seem to be getting worse and are unresponsive to treatment during an asthma attack.  You are short of breath even at rest.  You get short of breath when doing very little physical activity.  You have difficulty eating, drinking, or talking due to asthma symptoms.  You develop chest pain.  You develop a fast heartbeat.  You have a bluish color to your lips or fingernails.  You are light-headed, dizzy, or faint.  Your peak flow is less than 50% of your personal best. MAKE SURE YOU:   Understand these instructions.  Will watch your condition.  Will get help right away if you are not doing well or get worse. Document Released: 08/13/2005 Document Revised: 12/28/2013 Document Reviewed: 03/12/2013 [facility] Patient Information 2015 [place], [place]. This information is not intended to replace advice given to you by your health care provider. Make sure you discuss any questions you have with your health care provider.  Emergency Department Resource Guide 1) Find a Doctor and Pay Out of Pocket Although you won't have to find  out who is covered by your insurance plan, it is a good idea to ask around and get recommendations. You will then need to call the office and see if the doctor you have chosen will accept you as a new patient and what types of options they offer for patients who are self-pay. Some doctors offer discounts or will set up payment plans for their patients who do not have insurance, but you will need to ask so you aren't surprised when you get to your appointment.  2) Contact Your Local Health Department Not all health departments have doctors that can see patients for sick visits, but many do, so it is worth a call to see if yours does. If you don't know where your local health department is, you can check in your phone book. The CDC also has a tool to help you locate your state's health department, and many state websites also have listings  of all of their local health departments.  3) Find a Walk-in Clinic If your illness is not likely to be very severe or complicated, you may want to try a walk in clinic. These are popping up all over the country in pharmacies, drugstores, and shopping centers. They're usually staffed by nurse practitioners or physician assistants that have been trained to treat common illnesses and complaints. They're usually fairly quick and inexpensive. However, if you have serious medical issues or chronic medical problems, these are probably not your best option.  No Primary Care Doctor: - Call Health Connect at  [phone] - they can help you locate a primary care doctor that  accepts your insurance, provides certain services, etc. - Physician Referral Service- [phone]  Chronic Pain Problems: Organization         Address  Phone   Notes  [facility]  [phone] Patients need to be referred by their primary care doctor.   Medication Assistance: Organization         Address  Phone   Notes  [facility] Medication [facility] [address]  [place] [place], [place] [address] [phone] --Must be a resident of [facility] -- Must have NO insurance coverage whatsoever (no Medicaid/ Medicare, etc.) -- The pt. MUST have a primary care doctor that directs their care regularly and follows them in the community   [facility]ist  [phone]   [employer]  [phone]    Agencies that provide inexpensive medical care: Organization         Address  Phone   Notes  [name]  [phone]   [name]    [phone]   [facility] [address] [place], [place] [address] [phone]   [facility] [address] [place] [place] [phone]   Planned Parenthood    [phone]   [facility]    [phone]   [facility]  [address], [place] Phone:  [phone], Fax:  [phone] Hours of Operation:  9 am - 6 pm, M-F.  Also accepts Medicaid/Medicare and self-pay.  [facility]  [address], [place] Phone: [phone], Fax: [phone]. Hours of Operation:  8:30 am - 5:30 pm, M-F.  Also accepts Medicaid and self-pay.  [facility] [place] [address], [place] Phone: [phone]   [facility] [address] Natasha Bence [place], [place] [phone], Ext. [phone] Mondays & Thursdays: 7-9 AM.  First 15 patients are seen on a first come, first serve basis.    Medicaid-accepting [facility] Providers:  Organization         Address  Phone   Notes  [facility] [address], [place] [phone] Also accepts self-pay patients.  [facility] [address] Laurell Josephs [place], [place]  [phone]   [facility] [address], [place] [phone]   [facility] [address], [place] [phone]   Renaye Rakers  [address], [place]   [phone] Only accepts [place] patients after they have their name applied to their card.   Self-Pay (no insurance) in [facility]:  Retail buyer   Notes  Sickle Cell Patients, [facility] [address] [place], [place] [phone]   [facility] [address] [place], [place] [phone]   [name] [place]  [address], [place] [phone]   [facility]/Dr. Osei-Bonsu  [address] [place] or [address], [place] [phone] Phone number for both [place] and [place] locations is the same.  [facility] [address], [place] [phone]   [facility] [address], [place] or [address] [phone]   [facility] [address] [place] [phone], phone; [phone], fax Sees patients 1st and 3rd Saturday of every month.  Must not qualify for public or private insurance (i.e. Medicaid, Medicare, [place] Health Choice, Veterans' Benefits)  Household income should be no more than 200% of the poverty level The clinic cannot treat you if you are pregnant or think you are pregnant  Sexually transmitted diseases are not treated at the clinic.    Dental Care: Organization         Address  Phone  Notes  [facility] of [facility] [address] [place], [place] [phone] Accepts children up to age 73 who are enrolled in [place] or [place] Health Choice; pregnant women with a Medicaid card; and children who have applied for Medicaid or [place] Health Choice, but were declined, whose parents can pay a reduced fee at time of service.  [facility] Department of [facility]  [address], [place] [phone] Accepts children up to age 48  who are enrolled in [place] or [place] Health Choice; pregnant women with a Medicaid card; and children who have applied for Medicaid or [place] Health Choice, but were declined, whose parents can pay a reduced fee at time of service.  [facility]  [address] [place], [place] [phone] Patients are seen by appointment only. Walk-ins are not accepted. [facility] will see patients 76 years of age and older. Monday - Tuesday (8am-5pm) Most Wednesdays (8:30-5pm) $30 per visit, cash only  [facility] Adult Dental Access PRO[facility]  [address], [facility] [phone] Patients are seen by appointment only. Walk-ins are not accepted. [facility] will see patients 46 years of age and older. One Wednesday Evening (Monthly: Volunteer Based).  $30 per visit, cash only  [employer]  [phone] for adults; Children under age 50, call Graduate Pediatric Dentistry at [phone]. Children aged 103-14, please call [phone] to request a pediatric application.  Dental services are provided in all areas of dental care including fillings, crowns and bridges, complete and partial dentures, implants, gum treatment, root canals, and extractions. Preventive care is also provided. Treatment is provided to both adults and children. Patients are selected via a lottery and there is often a waiting list.   [facility] [address] [place]  [phone] [email or web address]   [facility] [address] [place], [place] [phone], Ext. [phone] Second and Fourth Thursday of each month, opens at 6:30 AM; Clinic ends at 9 AM.  Patients are seen on a first-come first-served basis, and a limited number are seen during each clinic.   [facility]  [address] Ether Griffins [place], [place] [phone]   Eligibility Requirements  You must have lived in [place], [place], or [place] counties for at least the last three months.    You cannot be eligible for state or federal sponsored [place], including [employer], [place], or [employer].   You generally cannot be eligible for healthcare insurance through your employer.    How to apply: Eligibility screenings are held every Tuesday and Wednesday afternoon from 1:00 pm until 4:00 pm. You do not need an appointment for the interview!  [facility] [address] [place], [place] [phone]   [facility] Health Department  [phone]   [facility] Health Department  [phone]   [facility] Health Department  [phone]    Behavioral Health Resources in the Community: Intensive Outpatient Programs Organization         Address  Phone  Notes  [facility] [place] Services [address] [place], [place] [phone]   [facility] Outpatient [address] [place], [place] [phone]   [facility] [address] [place], [place]  [phone]   [facility] [address],  [place], [place] [phone] or [phone]   Substance Abuse Resources Organization         Address  Phone  Notes  [facility]  [phone]   [facility]  [phone]   [facility] [place]  [phone]   Floydene Flock  [phone]   Residential & Outpatient Substance Abuse Program  [phone]   Psychological Services Organization         Address  Phone  Notes  [facility]  [phone]   [facility]  [phone]   [facility] Mental Health [address], [place] [phone] or [phone]    Mobile Crisis Teams Organization         Address  Phone  Notes  [facility], Mobile Crisis Care Unit  [phone]   [facility]  [address] [place], [place] [phone]   Doristine Locks [address] [place] [phone]    Self-Help/Support  Groups Organization         Address  Phone             Notes  [facility] [place] - variety of support groups  336- I7437963 Call for more information  Narcotics Anonymous (NA), [facility] [address], [employer] [place]  2 meetings at this location   Statistician         Address  Phone  Notes  [facility] [address],    [place]  [phone]   [facility]  [address] [place] [address], [place], [place] [phone]   [facility] [address] [place], [place] [phone] Admissions: 8am-3pm M-F  [facility] [address]    [place], [place] [phone]   [facility] [address] Starling Manns [place], [place] [phone]   [facility] [address],  [place], [place] [phone]   [facility] - Intensive Outpatient [address] Laurell Joseph[address], [place], [place] [phone]   [facility] ([facility] [address] [place]  [place], [place] [phone] or [phone]   [facility] ([facility]) [address], [place], [place] [phone] Accepts Medicaid  [facility] [address]  [place] [phone] Substance Abuse/Addiction Treatment   [facility] Resources Organization  Address  Phone  Notes  [facility]  [phone]   Angie Fava, PhD [address] Ervin Knack [place], [place]   [phone] or [phone]   [facility]   [address] [place], [place] [phone]   [facility] [address], [place], [place] [phone] Insurance/Medicaid/sponsorship through [facility] and Families [address]                                    [place], [place] [phone] Therapy/tele-psych/case  [facility] [address], [place] [phone]    Dr. Lolly Mustache  [phone]   [facility]  [employer] [facility] Dept. 1) [address] [place] 2) [address] [place] 3)  [address], [place] [phone]  [phone]   [facility] Child Abuse Hotline 513 614 1811 or [phone] (After Hours)

## 2016-01-05 IMAGING — CT CT ANGIO CHEST
1 of 2 series · 19 of 32 positions shown · IV contrast (OMNIPAQUE 350)
Comparison: Chest x-ray earlier today.

CLINICAL DATA: Cough, congestion, chest pain and shortness of
breath.

EXAM:
CT ANGIOGRAPHY CHEST WITH CONTRAST
TECHNIQUE: Multidetector CT imaging of the chest was performed using the
standard protocol during bolus administration of intravenous
contrast. Multiplanar CT image reconstructions and MIPs were
obtained to evaluate the vascular anatomy.
CONTRAST:  100mL OMNIPAQUE IOHEXOL 350 MG/ML SOLN

[Series 6: thins for pacs · axial · 0.74mm/px · z∈[+1478,+1714]mm · 19 of 264 slices shown]
[im 14/264  lung]
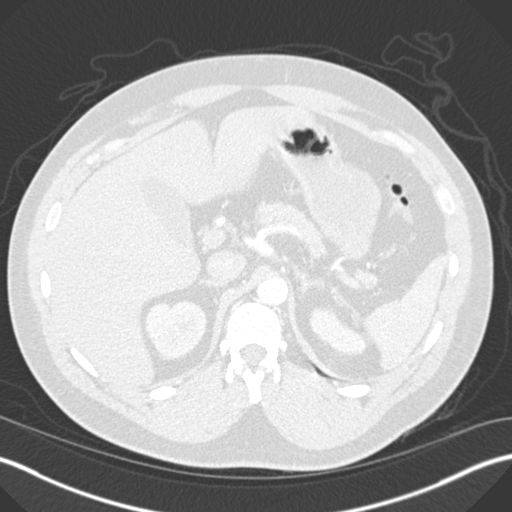
[im 27/264  mediastinal]
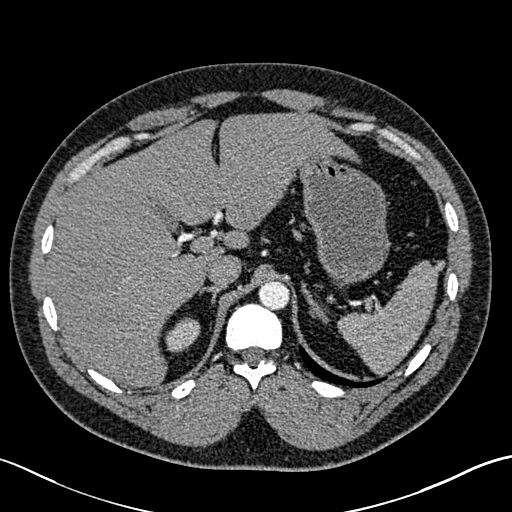
[im 40/264  lung]
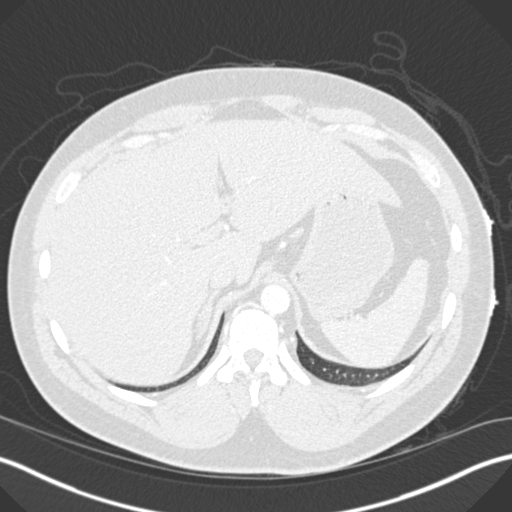
[im 66/264  mediastinal]
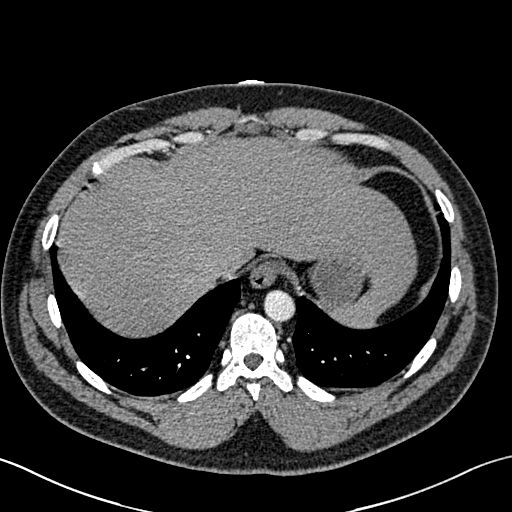
[im 79/264  lung]
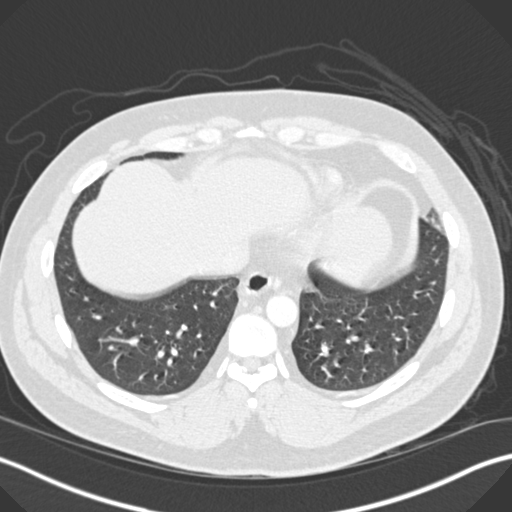
[im 88/264  mediastinal]
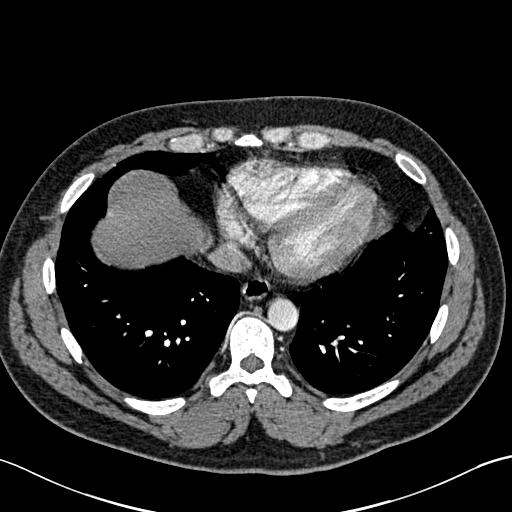
[im 93/264  lung]
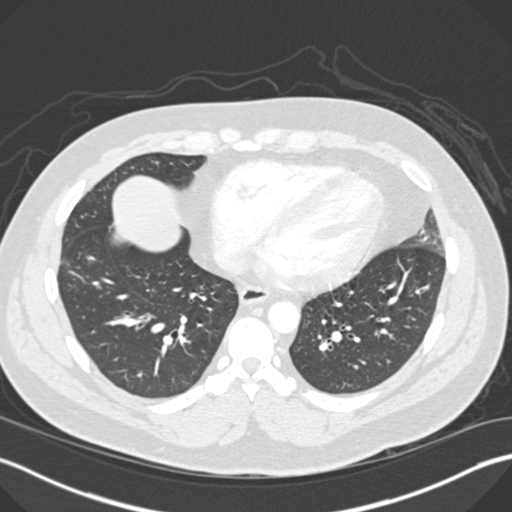
[im 106/264  mediastinal]
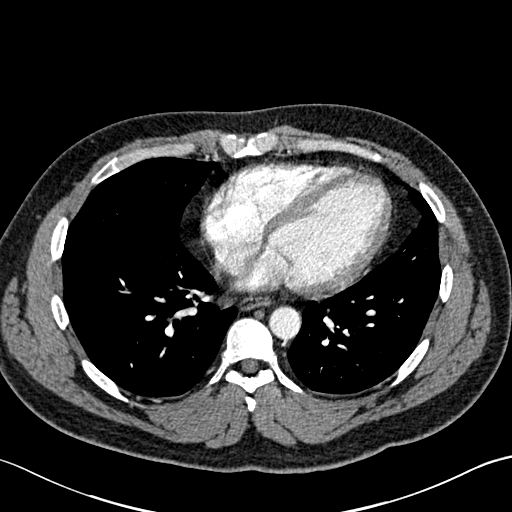
[im 119/264  lung]
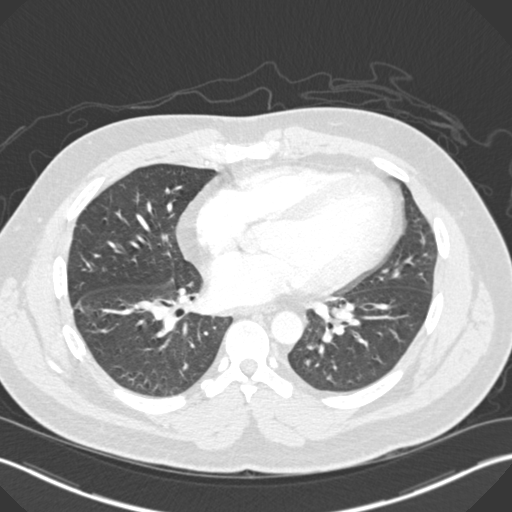
[im 132/264  mediastinal]
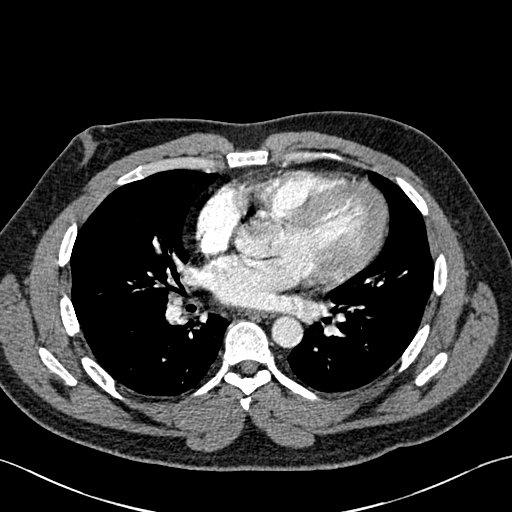
[im 145/264  lung]
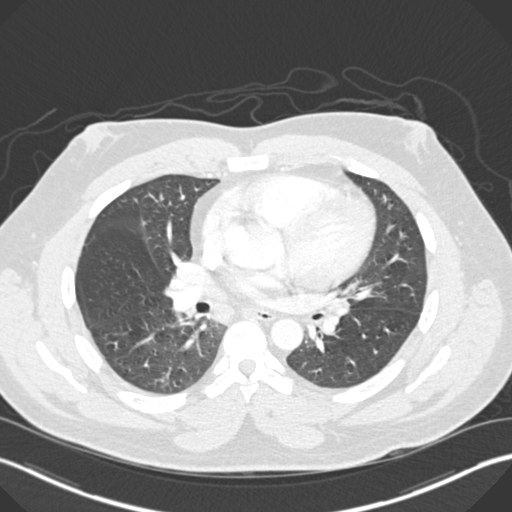
[im 158/264  mediastinal]
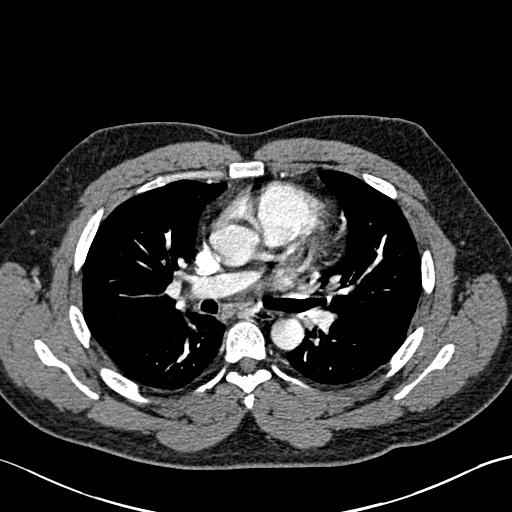
[im 171/264  lung]
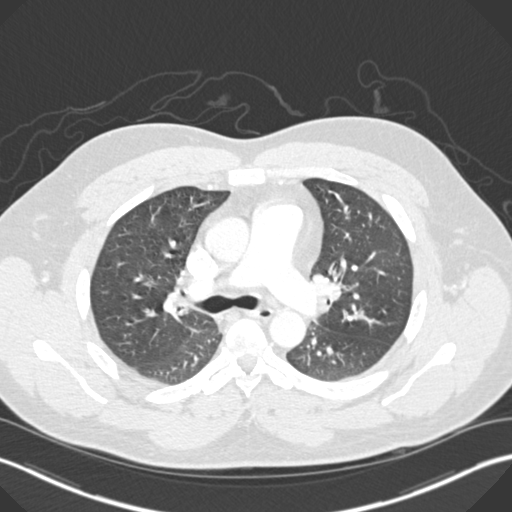
[im 176/264  mediastinal]
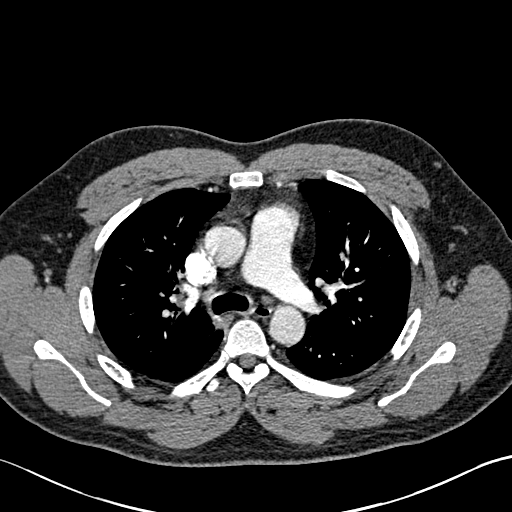
[im 185/264  lung]
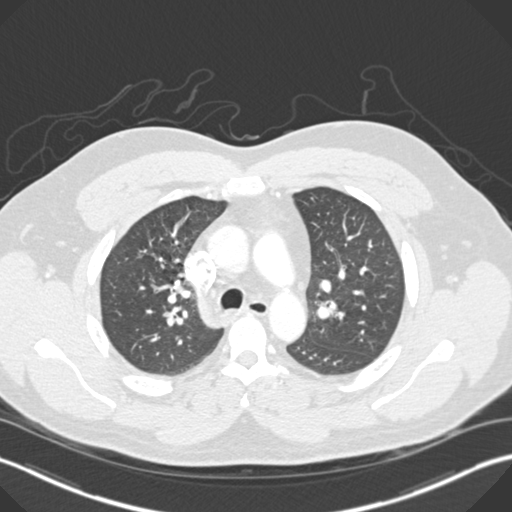
[im 198/264  mediastinal]
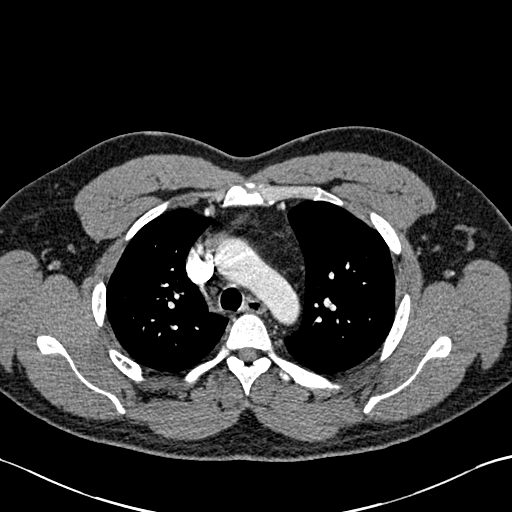
[im 224/264  lung]
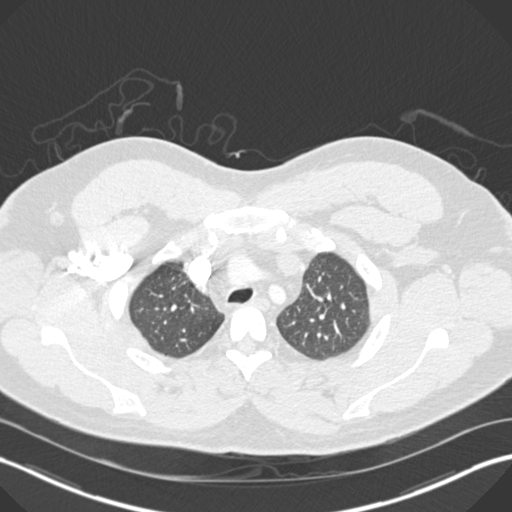
[im 237/264  mediastinal]
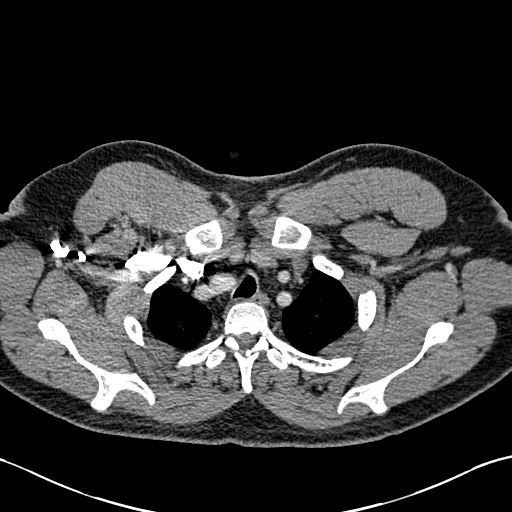
[im 250/264  lung]
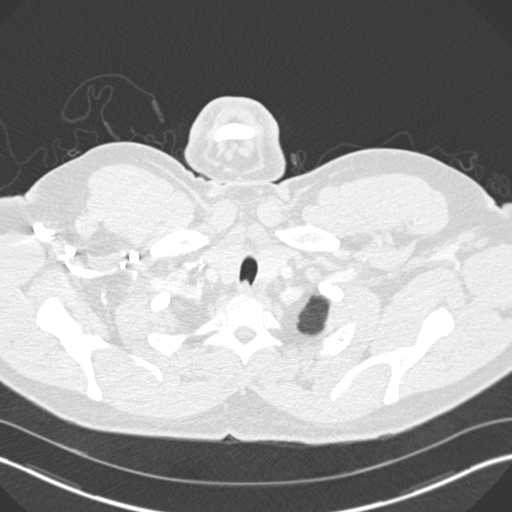

[19 of 32 positions shown; findings below may reference images not displayed]

FINDINGS: The pulmonary arteries are well opacified. There is no evidence of
pulmonary embolism. The thoracic aorta is also adequately opacified
and shows no evidence of aneurysmal disease or dissection. Proximal
great vessels show bovine branching anatomy and normal patency.

The heart size is normal. No pleural or pericardial fluid is
identified. Lungs show minimal suggestion bronchial thickening in
the lower lung zones bilaterally. No evidence of focal infiltrate,
consolidation, edema, pneumothorax or nodule.

No lymphadenopathy is identified. No evidence of airway obstruction
or bronchiectasis. Visualized upper abdominal structures are within
normal limits. The bony thorax is unremarkable.

Review of the MIP images confirms the above findings.
IMPRESSION: No evidence of pulmonary embolism. Suggestion of minimal bronchial
thickening in both lower lung zones.

## 2016-04-16 ENCOUNTER — Emergency Department (HOSPITAL_COMMUNITY): Payer: Self-pay

## 2016-04-16 ENCOUNTER — Encounter (HOSPITAL_COMMUNITY): Payer: Self-pay

## 2016-04-16 ENCOUNTER — Emergency Department (HOSPITAL_COMMUNITY)
Admission: EM | Admit: 2016-04-16 | Discharge: 2016-04-16 | Disposition: A | Discharge status: Home / Self Care | Payer: Self-pay | Attending: Emergency Medicine | Admitting: Emergency Medicine

## 2016-04-16 DIAGNOSIS — M79672 Pain in left foot: Secondary | ICD-10-CM

## 2016-04-16 DIAGNOSIS — Z79899 Other long term (current) drug therapy: Secondary | ICD-10-CM | POA: Insufficient documentation

## 2016-04-16 DIAGNOSIS — Z7982 Long term (current) use of aspirin: Secondary | ICD-10-CM | POA: Insufficient documentation

## 2016-04-16 DIAGNOSIS — Z87891 Personal history of nicotine dependence: Secondary | ICD-10-CM | POA: Insufficient documentation

## 2016-04-16 DIAGNOSIS — Y929 Unspecified place or not applicable: Secondary | ICD-10-CM | POA: Insufficient documentation

## 2016-04-16 DIAGNOSIS — J45909 Unspecified asthma, uncomplicated: Secondary | ICD-10-CM | POA: Insufficient documentation

## 2016-04-16 DIAGNOSIS — Y999 Unspecified external cause status: Secondary | ICD-10-CM | POA: Insufficient documentation

## 2016-04-16 DIAGNOSIS — S93602A Unspecified sprain of left foot, initial encounter: Secondary | ICD-10-CM | POA: Insufficient documentation

## 2016-04-16 DIAGNOSIS — Y939 Activity, unspecified: Secondary | ICD-10-CM | POA: Insufficient documentation

## 2016-04-16 DIAGNOSIS — X58XXXA Exposure to other specified factors, initial encounter: Secondary | ICD-10-CM | POA: Insufficient documentation

## 2016-04-16 MED ORDER — HYDROCODONE-ACETAMINOPHEN 5-325 MG PO TABS: 1.0000 | ORAL_TABLET | Freq: Four times a day (QID) | ORAL | 0 refills | Status: DC | PRN

## 2016-04-16 MED ORDER — ALBUTEROL SULFATE HFA 108 (90 BASE) MCG/ACT IN AERS
1.0000 | INHALATION_SPRAY | Freq: Four times a day (QID) | RESPIRATORY_TRACT | 0 refills | Status: DC | PRN
Start: 2016-04-16 — End: 2016-05-03

## 2016-04-16 MED ORDER — ALBUTEROL SULFATE HFA 108 (90 BASE) MCG/ACT IN AERS
2.0000 | INHALATION_SPRAY | Freq: Once | RESPIRATORY_TRACT | Status: AC
Start: 2016-04-16 — End: 2016-04-16
  Administered 2016-04-16: 2 via RESPIRATORY_TRACT
  Filled 2016-04-16: qty 6.7

## 2016-04-16 MED ORDER — HYDROCODONE-ACETAMINOPHEN 5-325 MG PO TABS
2.0000 | ORAL_TABLET | Freq: Once | ORAL | Status: AC
  Administered 2016-04-16: 2 via ORAL
  Filled 2016-04-16: qty 2

## 2016-04-16 NOTE — ED Triage Notes (Signed)
Pt here with left foot pain since Friday. Does not recall specific injury.  Painful to ambulate.

## 2016-04-16 NOTE — ED Notes (Signed)
Patient transported to X-ray 
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
## 2016-05-02 ENCOUNTER — Emergency Department (HOSPITAL_COMMUNITY)
Admission: EM | Admit: 2016-05-02 | Discharge: 2016-05-02 | Disposition: A | Discharge status: Home / Self Care | Payer: No Typology Code available for payment source | Attending: Dermatology | Admitting: Dermatology

## 2016-05-02 ENCOUNTER — Encounter (HOSPITAL_COMMUNITY): Payer: Self-pay | Admitting: Emergency Medicine

## 2016-05-02 DIAGNOSIS — R0602 Shortness of breath: Secondary | ICD-10-CM | POA: Insufficient documentation

## 2016-05-02 DIAGNOSIS — Z5321 Procedure and treatment not carried out due to patient leaving prior to being seen by health care provider: Secondary | ICD-10-CM | POA: Insufficient documentation

## 2016-05-02 LAB — CBC
HCT: 45.1 % (ref 39.0–52.0)
Hemoglobin: 15.7 g/dL (ref 13.0–17.0)
MCH: 33.7 pg (ref 26.0–34.0)
MCHC: 34.8 g/dL (ref 30.0–36.0)
MCV: 96.8 fL (ref 78.0–100.0)
PLATELETS: 389 10*3/uL (ref 150–400)
RBC: 4.66 MIL/uL (ref 4.22–5.81)
RDW: 13.5 % (ref 11.5–15.5)
WBC: 7 10*3/uL (ref 4.0–10.5)

## 2016-05-02 LAB — BASIC METABOLIC PANEL
Anion gap: 7 (ref 5–15)
BUN: 10 mg/dL (ref 6–20)
CALCIUM: 9.4 mg/dL (ref 8.9–10.3)
CO2: 27 mmol/L (ref 22–32)
CREATININE: 0.98 mg/dL (ref 0.61–1.24)
Chloride: 104 mmol/L (ref 101–111)
GFR calc non Af Amer: >60 mL/min (ref >60)
Glucose, Bld: 98 mg/dL (ref 65–99)
Potassium: 3.2 mmol/L — ABNORMAL LOW (ref 3.5–5.1)
SODIUM: 138 mmol/L (ref 135–145)

## 2016-05-02 MED ORDER — ALBUTEROL SULFATE (2.5 MG/3ML) 0.083% IN NEBU
5.0000 mg | INHALATION_SOLUTION | Freq: Once | RESPIRATORY_TRACT | Status: AC
  Administered 2016-05-02: 5 mg via RESPIRATORY_TRACT
  Filled 2016-05-02: qty 6

## 2016-05-02 NOTE — ED Triage Notes (Signed)
Pt has hx of asthma. Has had SOB for past few days. Tried home treatments with no relief. Also having CP

## 2016-05-03 ENCOUNTER — Encounter (HOSPITAL_COMMUNITY): Payer: Self-pay | Admitting: Emergency Medicine

## 2016-05-03 ENCOUNTER — Emergency Department (HOSPITAL_COMMUNITY): Payer: No Typology Code available for payment source

## 2016-05-03 ENCOUNTER — Emergency Department (HOSPITAL_COMMUNITY)
Admission: EM | Admit: 2016-05-03 | Discharge: 2016-05-03 | Disposition: A | Discharge status: Home / Self Care | Payer: No Typology Code available for payment source | Attending: Emergency Medicine | Admitting: Emergency Medicine

## 2016-05-03 DIAGNOSIS — J45901 Unspecified asthma with (acute) exacerbation: Secondary | ICD-10-CM | POA: Insufficient documentation

## 2016-05-03 DIAGNOSIS — Z87891 Personal history of nicotine dependence: Secondary | ICD-10-CM | POA: Insufficient documentation

## 2016-05-03 DIAGNOSIS — Z79899 Other long term (current) drug therapy: Secondary | ICD-10-CM | POA: Insufficient documentation

## 2016-05-03 LAB — CBC WITH DIFFERENTIAL/PLATELET
BASOS ABS: 0.1 10*3/uL (ref 0.0–0.1)
BASOS PCT: 1 %
EOS ABS: 0.3 10*3/uL (ref 0.0–0.7)
EOS PCT: 3 %
HCT: 44 % (ref 39.0–52.0)
HEMOGLOBIN: 15.2 g/dL (ref 13.0–17.0)
LYMPHS ABS: 1.2 10*3/uL (ref 0.7–4.0)
Lymphocytes Relative: 13 %
MCH: 33.5 pg (ref 26.0–34.0)
MCHC: 34.5 g/dL (ref 30.0–36.0)
MCV: 96.9 fL (ref 78.0–100.0)
Monocytes Absolute: 0.6 10*3/uL (ref 0.1–1.0)
Monocytes Relative: 6 %
NEUTROS PCT: 77 %
Neutro Abs: 7.3 10*3/uL (ref 1.7–7.7)
PLATELETS: 341 10*3/uL (ref 150–400)
RBC: 4.54 MIL/uL (ref 4.22–5.81)
RDW: 13.5 % (ref 11.5–15.5)
WBC: 9.4 10*3/uL (ref 4.0–10.5)

## 2016-05-03 LAB — I-STAT CHEM 8, ED
BUN: 9 mg/dL (ref 6–20)
CALCIUM ION: 1.13 mmol/L — AB (ref 1.15–1.40)
CHLORIDE: 102 mmol/L (ref 101–111)
Creatinine, Ser: 0.9 mg/dL (ref 0.61–1.24)
GLUCOSE: 134 mg/dL — AB (ref 65–99)
HCT: 46 % (ref 39.0–52.0)
HEMOGLOBIN: 15.6 g/dL (ref 13.0–17.0)
Potassium: 3.4 mmol/L — ABNORMAL LOW (ref 3.5–5.1)
SODIUM: 141 mmol/L (ref 135–145)
TCO2: 25 mmol/L (ref 0–100)

## 2016-05-03 MED ORDER — KETOROLAC TROMETHAMINE 30 MG/ML IJ SOLN
30.0000 mg | Freq: Once | INTRAMUSCULAR | Status: AC
  Administered 2016-05-03: 30 mg via INTRAVENOUS
  Filled 2016-05-03: qty 1

## 2016-05-03 MED ORDER — ALBUTEROL (5 MG/ML) CONTINUOUS INHALATION SOLN
10.0000 mg/h | INHALATION_SOLUTION | RESPIRATORY_TRACT | Status: DC
  Administered 2016-05-03: 10 mg/h via RESPIRATORY_TRACT
  Filled 2016-05-03: qty 20

## 2016-05-03 MED ORDER — ALBUTEROL SULFATE HFA 108 (90 BASE) MCG/ACT IN AERS: 1.0000 | INHALATION_SPRAY | Freq: Four times a day (QID) | RESPIRATORY_TRACT | 0 refills | Status: DC | PRN

## 2016-05-03 MED ORDER — AZITHROMYCIN 250 MG PO TABS: ORAL_TABLET | ORAL | 0 refills | Status: DC

## 2016-05-03 MED ORDER — LORATADINE 10 MG PO TABS: 10.0000 mg | ORAL_TABLET | Freq: Once | ORAL | Status: DC

## 2016-05-03 MED ORDER — ALBUTEROL (5 MG/ML) CONTINUOUS INHALATION SOLN
10.0000 mg/h | INHALATION_SOLUTION | RESPIRATORY_TRACT | Status: DC
  Administered 2016-05-03: 10 mg/h via RESPIRATORY_TRACT

## 2016-05-03 MED ORDER — ALBUTEROL SULFATE HFA 108 (90 BASE) MCG/ACT IN AERS
2.0000 | INHALATION_SPRAY | Freq: Once | RESPIRATORY_TRACT | Status: AC
  Administered 2016-05-03: 2 via RESPIRATORY_TRACT
  Filled 2016-05-03: qty 6.7

## 2016-05-03 MED ORDER — HYDROCODONE-ACETAMINOPHEN 5-325 MG PO TABS
1.0000 | ORAL_TABLET | Freq: Once | ORAL | Status: DC
  Filled 2016-05-03: qty 1

## 2016-05-03 MED ORDER — MAGNESIUM SULFATE 2 GM/50ML IV SOLN
2.0000 g | Freq: Once | INTRAVENOUS | Status: AC
  Administered 2016-05-03: 2 g via INTRAVENOUS
  Filled 2016-05-03: qty 50

## 2016-05-03 MED ORDER — PREDNISONE 10 MG (21) PO TBPK: 10.0000 mg | ORAL_TABLET | Freq: Every day | ORAL | 0 refills | Status: DC

## 2016-05-03 MED ORDER — ALBUTEROL SULFATE (2.5 MG/3ML) 0.083% IN NEBU: 5.0000 mg | INHALATION_SOLUTION | Freq: Once | RESPIRATORY_TRACT | Status: DC

## 2016-05-03 MED ORDER — LORATADINE 10 MG PO TABS: 10.0000 mg | ORAL_TABLET | Freq: Every day | ORAL | 0 refills | Status: DC

## 2016-05-03 NOTE — ED Notes (Signed)
Bed: [id number] Expected date:  Expected time:  Means of arrival:  Comments: EMS 35yo M wheezing

## 2016-05-03 NOTE — ED Notes (Addendum)
Pulse Read while Ambulating : Begin 125 dropped to 51 when walking back to the room. SPo2 98-100

## 2016-05-03 NOTE — ED Triage Notes (Signed)
Brought in by [facility] from home with c/o shortness of breath.  Pt reported that his rescue inhaler did not relieve his wheezing and shortness of breath.  Was seen here yesterday for same.  Pt was given Duoneb treatment and Solu-Medrol 125 mg IV en route.

## 2016-05-03 NOTE — Discharge Instructions (Signed)
Take prednisone as prescribed. Take azithromycin as prescribed. Take a Claritin daily for allergy relief. Use albuterol as needed. Follow up with PCP tomorrow to determine if change in medication is needed. Please return to the ED if you develop worsening symptoms.
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
## 2016-05-06 ENCOUNTER — Emergency Department (HOSPITAL_COMMUNITY): Payer: Self-pay

## 2016-05-06 ENCOUNTER — Encounter (HOSPITAL_COMMUNITY): Payer: Self-pay | Admitting: Emergency Medicine

## 2016-05-06 ENCOUNTER — Emergency Department (HOSPITAL_COMMUNITY)
Admission: EM | Admit: 2016-05-06 | Discharge: 2016-05-06 | Disposition: A | Discharge status: Home / Self Care | Payer: Self-pay | Attending: Emergency Medicine | Admitting: Emergency Medicine

## 2016-05-06 DIAGNOSIS — Z791 Long term (current) use of non-steroidal anti-inflammatories (NSAID): Secondary | ICD-10-CM | POA: Insufficient documentation

## 2016-05-06 DIAGNOSIS — F1290 Cannabis use, unspecified, uncomplicated: Secondary | ICD-10-CM | POA: Insufficient documentation

## 2016-05-06 DIAGNOSIS — Z87891 Personal history of nicotine dependence: Secondary | ICD-10-CM | POA: Insufficient documentation

## 2016-05-06 DIAGNOSIS — J45901 Unspecified asthma with (acute) exacerbation: Secondary | ICD-10-CM | POA: Insufficient documentation

## 2016-05-06 DIAGNOSIS — Z79899 Other long term (current) drug therapy: Secondary | ICD-10-CM | POA: Insufficient documentation

## 2016-05-06 LAB — CBC WITH DIFFERENTIAL/PLATELET
Basophils Absolute: 0 10*3/uL (ref 0.0–0.1)
Basophils Relative: 1 %
EOS PCT: 6 %
Eosinophils Absolute: 0.4 10*3/uL (ref 0.0–0.7)
HEMATOCRIT: 43.8 % (ref 39.0–52.0)
HEMOGLOBIN: 15.2 g/dL (ref 13.0–17.0)
LYMPHS ABS: 2 10*3/uL (ref 0.7–4.0)
Lymphocytes Relative: 34 %
MCH: 34.1 pg — AB (ref 26.0–34.0)
MCHC: 34.7 g/dL (ref 30.0–36.0)
MCV: 98.2 fL (ref 78.0–100.0)
Monocytes Absolute: 0.9 10*3/uL (ref 0.1–1.0)
Monocytes Relative: 16 %
NEUTROS ABS: 2.6 10*3/uL (ref 1.7–7.7)
NEUTROS PCT: 43 %
Platelets: 348 10*3/uL (ref 150–400)
RBC: 4.46 MIL/uL (ref 4.22–5.81)
RDW: 13.4 % (ref 11.5–15.5)
WBC: 6 10*3/uL (ref 4.0–10.5)

## 2016-05-06 LAB — BASIC METABOLIC PANEL
ANION GAP: 9 (ref 5–15)
BUN: 9 mg/dL (ref 6–20)
CALCIUM: 9 mg/dL (ref 8.9–10.3)
CHLORIDE: 103 mmol/L (ref 101–111)
CO2: 28 mmol/L (ref 22–32)
CREATININE: 0.92 mg/dL (ref 0.61–1.24)
GFR calc non Af Amer: >60 mL/min (ref >60)
Glucose, Bld: 96 mg/dL (ref 65–99)
Potassium: 3.5 mmol/L (ref 3.5–5.1)
SODIUM: 140 mmol/L (ref 135–145)

## 2016-05-06 MED ORDER — ALBUTEROL SULFATE HFA 108 (90 BASE) MCG/ACT IN AERS
1.0000 | INHALATION_SPRAY | Freq: Once | RESPIRATORY_TRACT | Status: AC
  Administered 2016-05-06: 1 via RESPIRATORY_TRACT
  Filled 2016-05-06: qty 6.7

## 2016-05-06 MED ORDER — METHYLPREDNISOLONE SODIUM SUCC 125 MG IJ SOLR
125.0000 mg | Freq: Once | INTRAMUSCULAR | Status: AC
  Administered 2016-05-06: 125 mg via INTRAVENOUS
  Filled 2016-05-06: qty 2

## 2016-05-06 MED ORDER — AEROCHAMBER PLUS FLO-VU MEDIUM MISC
1.0000 | Freq: Once | Status: AC
  Administered 2016-05-06: 1
  Filled 2016-05-06: qty 1

## 2016-05-06 MED ORDER — ALBUTEROL (5 MG/ML) CONTINUOUS INHALATION SOLN
10.0000 mg/h | INHALATION_SOLUTION | RESPIRATORY_TRACT | Status: DC
  Administered 2016-05-06: 10 mg/h via RESPIRATORY_TRACT
  Filled 2016-05-06: qty 20
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
## 2016-05-06 NOTE — ED Notes (Signed)
RN starting IV 

## 2016-05-06 NOTE — ED Triage Notes (Signed)
Patient here for his asthma. States that he was seen here couple times this week and was given inhaler, nebs, steroids and antibiotics, but after all this am chest still felt tight.

## 2016-05-06 NOTE — ED Notes (Signed)
Lurena Joinerebecca RN attempted two IVs.  Cala BradfordKimberly RN attempting now.

## 2016-05-06 NOTE — Discharge Instructions (Signed)
Avoid smoke.  Take the medications rx'd to you on the 7th.  Return if worse.

## 2016-10-16 ENCOUNTER — Ambulatory Visit (INDEPENDENT_AMBULATORY_CARE_PROVIDER_SITE_OTHER): Payer: Self-pay | Admitting: Orthopaedic Surgery

## 2016-10-18 ENCOUNTER — Ambulatory Visit (INDEPENDENT_AMBULATORY_CARE_PROVIDER_SITE_OTHER): Payer: Self-pay | Admitting: Orthopaedic Surgery

## 2016-11-05 ENCOUNTER — Ambulatory Visit (INDEPENDENT_AMBULATORY_CARE_PROVIDER_SITE_OTHER): Payer: Self-pay | Admitting: Orthopedic Surgery

## 2016-11-15 ENCOUNTER — Encounter (INDEPENDENT_AMBULATORY_CARE_PROVIDER_SITE_OTHER): Payer: Self-pay | Admitting: Orthopedic Surgery

## 2016-11-15 ENCOUNTER — Ambulatory Visit (INDEPENDENT_AMBULATORY_CARE_PROVIDER_SITE_OTHER): Payer: Self-pay | Admitting: Orthopedic Surgery

## 2016-11-15 ENCOUNTER — Encounter (INDEPENDENT_AMBULATORY_CARE_PROVIDER_SITE_OTHER): Payer: Self-pay

## 2016-11-15 VITALS — Ht 69.0 in | Wt 220.0 lb

## 2016-11-15 DIAGNOSIS — M21371 Foot drop, right foot: Secondary | ICD-10-CM | POA: Insufficient documentation

## 2016-11-15 MED ORDER — CYCLOBENZAPRINE HCL 10 MG PO TABS: 10.0000 mg | ORAL_TABLET | Freq: Three times a day (TID) | ORAL | 0 refills | Status: DC | PRN

## 2016-11-15 MED ORDER — GABAPENTIN 300 MG PO CAPS: 300.0000 mg | ORAL_CAPSULE | Freq: Three times a day (TID) | ORAL | 3 refills | Status: DC
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
## 2017-04-03 ENCOUNTER — Emergency Department (HOSPITAL_COMMUNITY): Payer: Self-pay

## 2017-04-03 ENCOUNTER — Encounter (HOSPITAL_COMMUNITY): Payer: Self-pay | Admitting: Emergency Medicine

## 2017-04-03 ENCOUNTER — Emergency Department (HOSPITAL_COMMUNITY)
Admission: EM | Admit: 2017-04-03 | Discharge: 2017-04-03 | Disposition: A | Discharge status: Home / Self Care | Payer: Self-pay | Attending: Emergency Medicine | Admitting: Emergency Medicine

## 2017-04-03 DIAGNOSIS — M25571 Pain in right ankle and joints of right foot: Secondary | ICD-10-CM | POA: Insufficient documentation

## 2017-04-03 DIAGNOSIS — J45909 Unspecified asthma, uncomplicated: Secondary | ICD-10-CM | POA: Insufficient documentation

## 2017-04-03 DIAGNOSIS — Z8739 Personal history of other diseases of the musculoskeletal system and connective tissue: Secondary | ICD-10-CM | POA: Insufficient documentation

## 2017-04-03 DIAGNOSIS — Z79899 Other long term (current) drug therapy: Secondary | ICD-10-CM | POA: Insufficient documentation

## 2017-04-03 DIAGNOSIS — Z87891 Personal history of nicotine dependence: Secondary | ICD-10-CM | POA: Insufficient documentation

## 2017-04-03 MED ORDER — TRAMADOL HCL 50 MG PO TABS: 50.0000 mg | ORAL_TABLET | Freq: Four times a day (QID) | ORAL | 0 refills | Status: DC | PRN

## 2017-04-03 MED ORDER — HYDROCODONE-ACETAMINOPHEN 5-325 MG PO TABS
1.0000 | ORAL_TABLET | Freq: Once | ORAL | Status: AC
  Administered 2017-04-03: 1 via ORAL
  Filled 2017-04-03: qty 1

## 2017-04-03 NOTE — Discharge Instructions (Signed)
Ankle Pain and Foot Pain  For pain control you may take:  800mg  of ibuprofen (that is usually 4 over the counter pills)  3 times a day (take with food) and acetaminophen 975mg  (this is 3 over the counter pills) four times a day. Do not drink alcohol or combine with other medications that have acetaminophen as an ingredient (Read the labels!).  For breakthrough pain you may take tramadol. Do not drink alcohol drive or operate heavy machinery when taking tramadol. You are noted to have an allergy to naproxen (nausea). This medication is similar to ibuprofen. Do not take ibuprofen if it causes you any side effects.    For activity: Use crutches with nonweightbearing for the first few days. Then, you may walk on your ankles as the pain allows, or as instructed. Start gradually with weight bearing on the affected ankle. Once you can walk pain free, then try jogging. When you can run forwards, then you can try moving side to side. If you cannot walk without crutches in one week, you need a recheck by your Orthopedics. I would like you to follow up with Ortho regardless to discuss physical therapy and other treatments for your foot drop and ankle pain to prevent future falls.   Seek immediate medical attention if: You're toes are numb or tingling, appear gray or blue, or you have severe pain (also elevate the leg and loosen the splint if this occurs)  If you do not have a family doctor to followup with, you can see the list of phone numbers below. Please call today to make a followup appointment.   RICE therapy:  Routine Care for injuries  Rest, Ice, Compression, Elevation (RICE)  Rest is needed to allow your body to heal. Routine activities can be resumed when comfortable. Injury tendons and bones can take up to 6 weeks to heal. Tendons are cordlike structures that attach muscles and bones.  Ice following an injury helps keep the swelling down and reduce the pain. Put ice in a plastic bag. Place a towel  between your skin and the bag of ice. Leave the ice on for 15-20 minutes, 3-4 times a day. Do this while awake, for the first 24-48 hours. After that continue as directed by your caregiver.  Compression helps keep swelling down. It also gives support and helps with discomfort. If any lasting bandage has been applied, it should be removed and reapplied every 3-4 hours. It should not be applied tightly, but firmly enough to keep swelling down. Watch fingers or toes for swelling, discoloration, coldness, numbness or excessive pain. If any of these problems occur, removed the bandage and reapply loosely. Contact your caregiver if these problems continue.  Elevation helps reduce swelling and decrease your pain. With extremities such as the arms, hands, legs and feet, the injured area should be placed near or above the level of the heart if possible.  Seek immediate medical care if you have persistent pain and swelling, developed redness numbness or unexpected weakness, your symptoms are getting worse rather than improving after several days. The symptoms may indicate that further evaluation or further x-rays are needed. Sometimes, x-rays may not show a small broken bone until one week or 10 days later. Make a followup appointment with your caregiver. Ask when your x-ray results will be ready. Make sure you get your x-ray results  If you develop worsening or new concerning symptoms you can return to the emergency department for re-evaluation.   Your blood pressure  was elevated during today's visit. Please discuss this with your PCP during your follow-up appointment to determine if a medication adjustment/addition is needed for this
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
## 2017-04-03 NOTE — ED Triage Notes (Signed)
Patient reports about 2 weeks ago he "had foot drop and fell in parking lot and having pain in foot and ankle esp with weight bearing".

## 2017-07-30 IMAGING — CR DG CHEST 2V
2 series · 2 of 2 positions shown · non-contrast
Comparison: May 03, 2016

CLINICAL DATA: Shortness of breath and cough

EXAM:
CHEST  2 VIEW

[w chest pa]
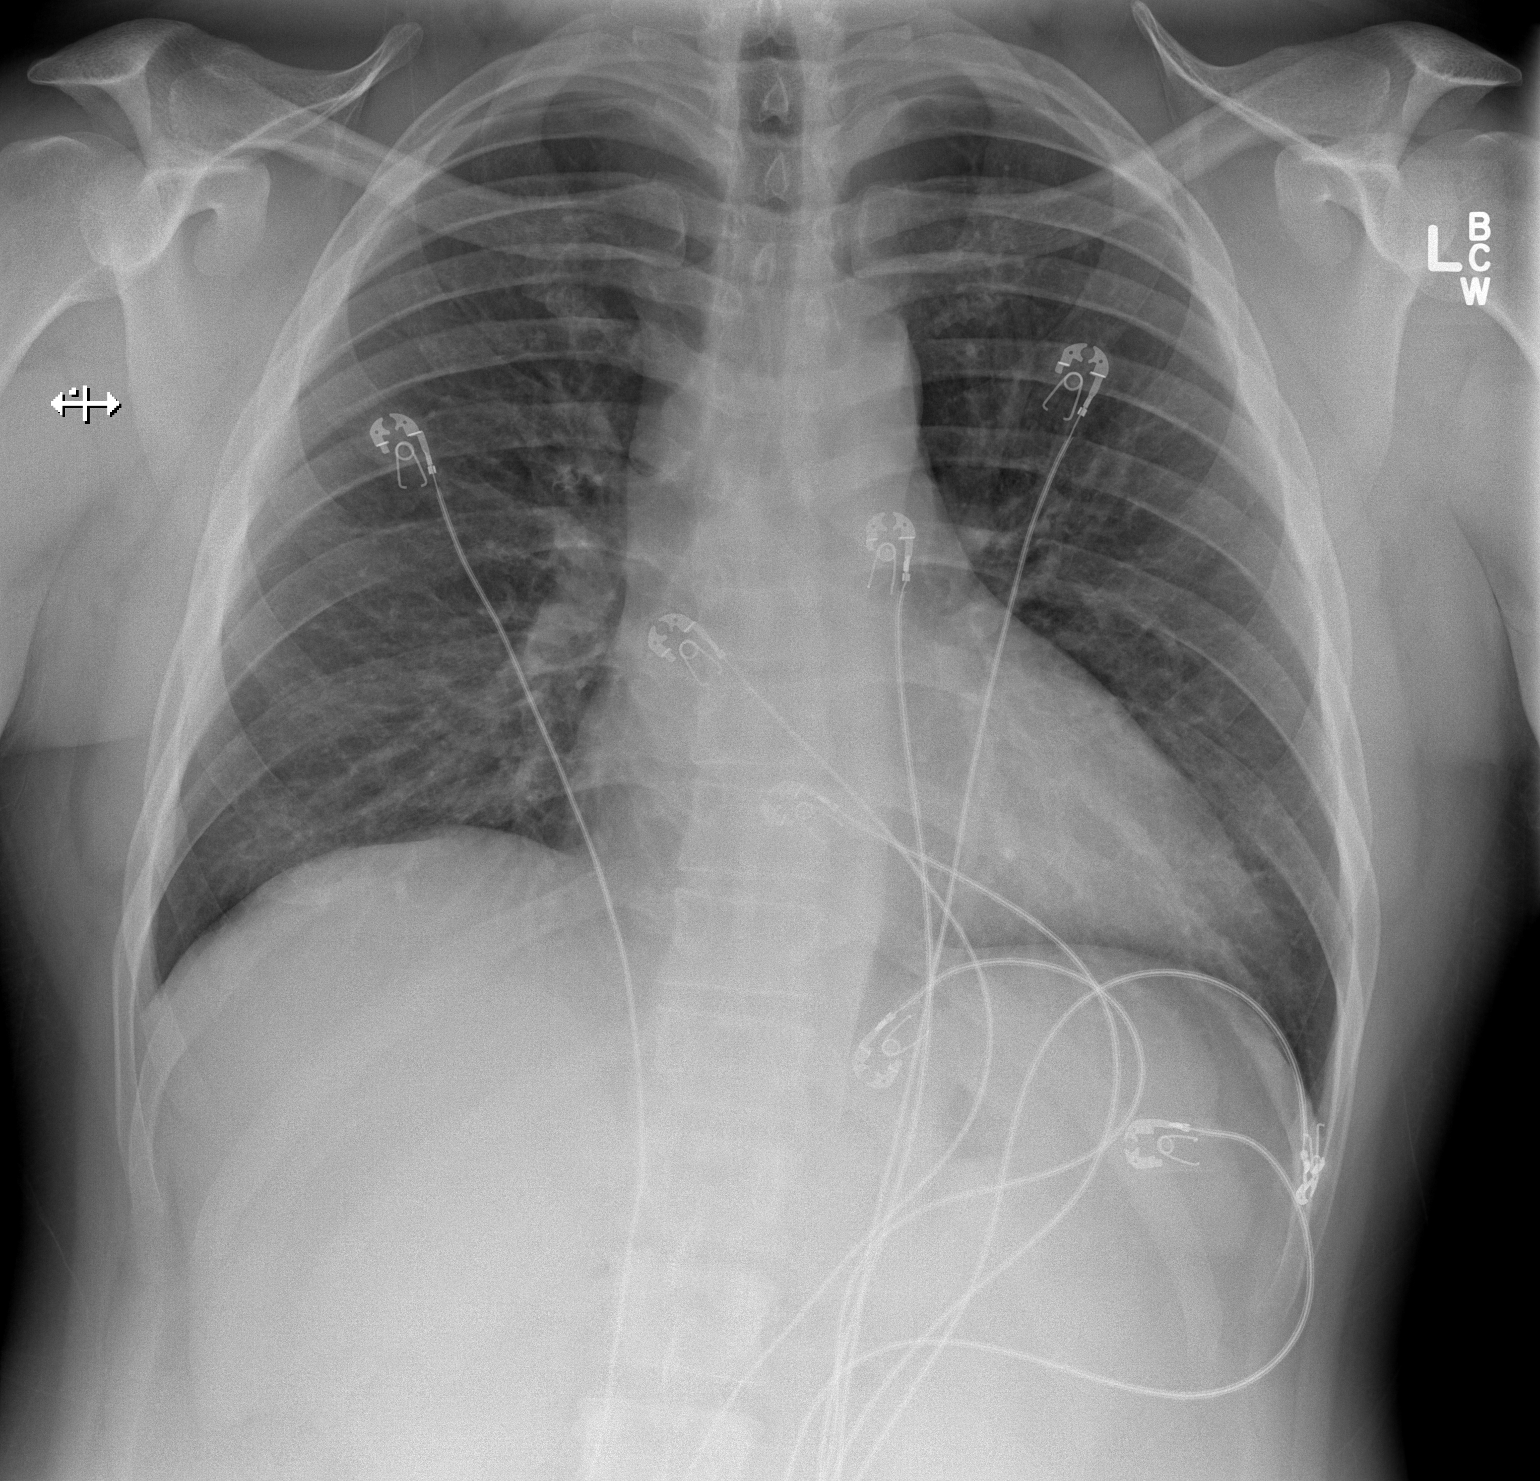

[w chest lat]
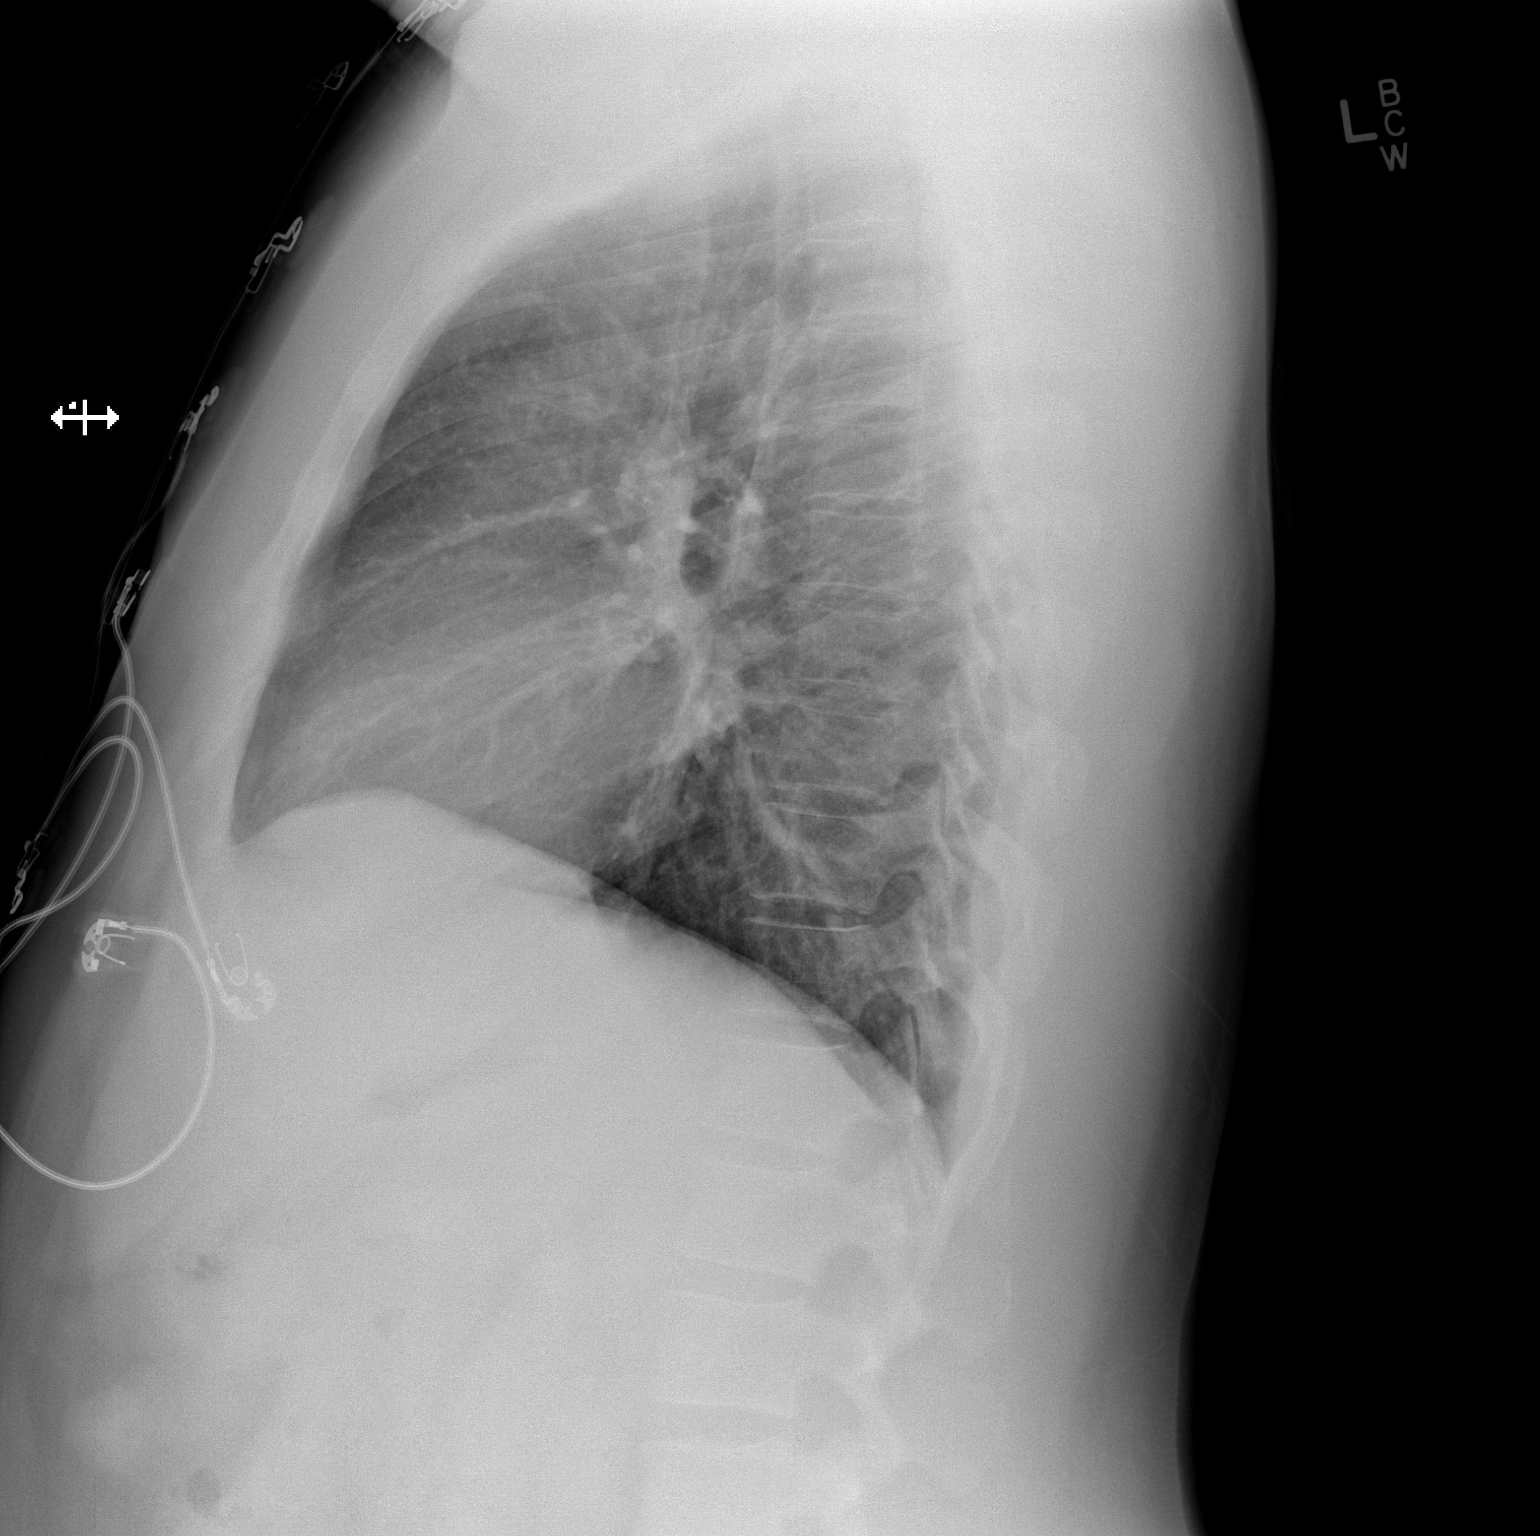

[2 of 2 positions shown; findings below may reference images not displayed]

FINDINGS: The heart size mildly enlarged. The hila and mediastinum are normal.
No pneumothorax. No pulmonary nodules or masses. No focal
infiltrates. No overt edema. Mild opacity in the medial right lung
base, likely vascular crowding.
IMPRESSION: No active cardiopulmonary disease.

## 2018-04-05 ENCOUNTER — Encounter (HOSPITAL_COMMUNITY): Payer: Self-pay | Admitting: Emergency Medicine

## 2018-04-05 ENCOUNTER — Emergency Department (HOSPITAL_COMMUNITY)
Admission: EM | Admit: 2018-04-05 | Discharge: 2018-04-05 | Disposition: A | Discharge status: Home / Self Care | Payer: Self-pay | Attending: Emergency Medicine | Admitting: Emergency Medicine

## 2018-04-05 ENCOUNTER — Other Ambulatory Visit: Payer: Self-pay

## 2018-04-05 DIAGNOSIS — Z79899 Other long term (current) drug therapy: Secondary | ICD-10-CM | POA: Insufficient documentation

## 2018-04-05 DIAGNOSIS — J45909 Unspecified asthma, uncomplicated: Secondary | ICD-10-CM | POA: Insufficient documentation

## 2018-04-05 DIAGNOSIS — Z87891 Personal history of nicotine dependence: Secondary | ICD-10-CM | POA: Insufficient documentation

## 2018-04-05 DIAGNOSIS — G44019 Episodic cluster headache, not intractable: Secondary | ICD-10-CM | POA: Insufficient documentation

## 2018-04-05 MED ORDER — SODIUM CHLORIDE 0.9 % IV BOLUS
1000.0000 mL | Freq: Once | INTRAVENOUS | Status: AC
  Administered 2018-04-05: 1000 mL via INTRAVENOUS

## 2018-04-05 MED ORDER — DIPHENHYDRAMINE HCL 50 MG/ML IJ SOLN
25.0000 mg | Freq: Once | INTRAMUSCULAR | Status: DC
  Filled 2018-04-05: qty 1

## 2018-04-05 MED ORDER — DEXAMETHASONE SODIUM PHOSPHATE 10 MG/ML IJ SOLN
10.0000 mg | Freq: Once | INTRAMUSCULAR | Status: AC
  Administered 2018-04-05: 10 mg via INTRAVENOUS

## 2018-04-05 MED ORDER — DEXAMETHASONE SODIUM PHOSPHATE 10 MG/ML IJ SOLN
10.0000 mg | Freq: Once | INTRAMUSCULAR | Status: DC
  Filled 2018-04-05: qty 1

## 2018-04-05 MED ORDER — SODIUM CHLORIDE 0.9 % IV SOLN: INTRAVENOUS | Status: DC

## 2018-04-05 MED ORDER — KETOROLAC TROMETHAMINE 60 MG/2ML IM SOLN
30.0000 mg | Freq: Once | INTRAMUSCULAR | Status: DC
  Filled 2018-04-05: qty 2

## 2018-04-05 MED ORDER — KETOROLAC TROMETHAMINE 30 MG/ML IJ SOLN
15.0000 mg | Freq: Once | INTRAMUSCULAR | Status: AC
  Administered 2018-04-05: 15 mg via INTRAVENOUS

## 2018-04-05 MED ORDER — METOCLOPRAMIDE HCL 5 MG/ML IJ SOLN
10.0000 mg | Freq: Once | INTRAMUSCULAR | Status: DC
  Filled 2018-04-05: qty 2

## 2018-04-05 MED ORDER — DIPHENHYDRAMINE HCL 50 MG/ML IJ SOLN
12.5000 mg | Freq: Once | INTRAMUSCULAR | Status: AC
  Administered 2018-04-05: 12.5 mg via INTRAVENOUS

## 2018-04-05 MED ORDER — METOCLOPRAMIDE HCL 5 MG/ML IJ SOLN
5.0000 mg | Freq: Once | INTRAMUSCULAR | Status: AC
  Administered 2018-04-05: 5 mg via INTRAVENOUS

## 2018-04-05 NOTE — ED Triage Notes (Signed)
Patient c/o headache over left eye x1 week. Hx cluster headaches. Reports nausea and light sensitivity. States he took 2 100mg  Sumatriptan today without relief.
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
## 2018-05-05 ENCOUNTER — Emergency Department (HOSPITAL_COMMUNITY)
Admission: EM | Admit: 2018-05-05 | Discharge: 2018-05-05 | Disposition: A | Discharge status: Home / Self Care | Payer: Self-pay | Attending: Emergency Medicine | Admitting: Emergency Medicine

## 2018-05-05 ENCOUNTER — Encounter (HOSPITAL_COMMUNITY): Payer: Self-pay

## 2018-05-05 ENCOUNTER — Other Ambulatory Visit: Payer: Self-pay

## 2018-05-05 DIAGNOSIS — H53149 Visual discomfort, unspecified: Secondary | ICD-10-CM | POA: Insufficient documentation

## 2018-05-05 DIAGNOSIS — Z87891 Personal history of nicotine dependence: Secondary | ICD-10-CM | POA: Insufficient documentation

## 2018-05-05 DIAGNOSIS — F1290 Cannabis use, unspecified, uncomplicated: Secondary | ICD-10-CM | POA: Insufficient documentation

## 2018-05-05 DIAGNOSIS — G43809 Other migraine, not intractable, without status migrainosus: Secondary | ICD-10-CM

## 2018-05-05 MED ORDER — PROCHLORPERAZINE EDISYLATE 10 MG/2ML IJ SOLN
10.0000 mg | Freq: Once | INTRAMUSCULAR | Status: AC
  Administered 2018-05-05: 10 mg via INTRAVENOUS
  Filled 2018-05-05: qty 2

## 2018-05-05 MED ORDER — PROCHLORPERAZINE MALEATE 10 MG PO TABS: 10.0000 mg | ORAL_TABLET | Freq: Two times a day (BID) | ORAL | 0 refills | Status: DC | PRN

## 2018-05-05 MED ORDER — SODIUM CHLORIDE 0.9 % IV BOLUS
1000.0000 mL | Freq: Once | INTRAVENOUS | Status: AC
  Administered 2018-05-05: 1000 mL via INTRAVENOUS

## 2018-05-05 MED ORDER — KETOROLAC TROMETHAMINE 15 MG/ML IJ SOLN
15.0000 mg | Freq: Once | INTRAMUSCULAR | Status: AC
  Administered 2018-05-05: 15 mg via INTRAVENOUS
  Filled 2018-05-05: qty 1

## 2018-05-05 MED ORDER — HALOPERIDOL LACTATE 5 MG/ML IJ SOLN
2.0000 mg | Freq: Once | INTRAMUSCULAR | Status: AC
  Administered 2018-05-05: 2 mg via INTRAVENOUS
  Filled 2018-05-05: qty 1

## 2018-05-05 MED ORDER — DIPHENHYDRAMINE HCL 50 MG/ML IJ SOLN
25.0000 mg | Freq: Once | INTRAMUSCULAR | Status: AC
  Administered 2018-05-05: 25 mg via INTRAVENOUS
  Filled 2018-05-05: qty 1

## 2018-05-05 NOTE — ED Notes (Signed)
Pt stuck twice for IV access, both attempts unsuccessful

## 2018-05-05 NOTE — Discharge Instructions (Addendum)
You were evaluated in the Emergency Department and after careful evaluation, we did not find any emergent condition requiring admission or further testing in the hospital.  Your symptoms today seem to be due to a migraine.  You can try the medication provided as needed for return of migraine.  Please return to the Emergency Department if you experience any worsening of your condition.  We encourage you to follow up with a primary care provider.  Thank you for allowing [place] to be a part of your care.
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
## 2018-05-05 NOTE — ED Notes (Signed)
ED Provider at bedside. 

## 2018-05-05 NOTE — ED Triage Notes (Addendum)
Patient c/o left-sided migraine behind the left eye and left temple area. migraine since yesterday. Patient states the pain is so intense it is making him nauseated. Patient is sensitive to light.

## 2020-03-15 ENCOUNTER — Emergency Department (HOSPITAL_COMMUNITY)
Admission: EM | Admit: 2020-03-15 | Discharge: 2020-03-16 | Disposition: A | Discharge status: Home / Self Care | Payer: Self-pay | Attending: Emergency Medicine | Admitting: Emergency Medicine

## 2020-03-15 ENCOUNTER — Encounter (HOSPITAL_COMMUNITY): Payer: Self-pay

## 2020-03-15 ENCOUNTER — Other Ambulatory Visit: Payer: Self-pay

## 2020-03-15 DIAGNOSIS — K0889 Other specified disorders of teeth and supporting structures: Secondary | ICD-10-CM | POA: Insufficient documentation

## 2020-03-15 DIAGNOSIS — Z5321 Procedure and treatment not carried out due to patient leaving prior to being seen by health care provider: Secondary | ICD-10-CM | POA: Insufficient documentation

## 2020-03-15 NOTE — ED Triage Notes (Signed)
Left upper dental pain with swelling.

## 2020-03-16 NOTE — ED Notes (Signed)
Pt eloped from waiting area. Called 3X.  

## 2020-05-11 ENCOUNTER — Encounter (HOSPITAL_COMMUNITY): Payer: Self-pay

## 2020-05-11 ENCOUNTER — Other Ambulatory Visit: Payer: Self-pay

## 2020-05-11 ENCOUNTER — Ambulatory Visit (HOSPITAL_COMMUNITY)
Admission: EM | Admit: 2020-05-11 | Discharge: 2020-05-11 | Disposition: A | Discharge status: Home / Self Care | Payer: HRSA Program | Attending: Family Medicine | Admitting: Family Medicine

## 2020-05-11 ENCOUNTER — Ambulatory Visit (INDEPENDENT_AMBULATORY_CARE_PROVIDER_SITE_OTHER): Payer: HRSA Program

## 2020-05-11 ENCOUNTER — Emergency Department (HOSPITAL_COMMUNITY): Payer: Self-pay

## 2020-05-11 ENCOUNTER — Emergency Department (HOSPITAL_COMMUNITY)
Admission: EM | Admit: 2020-05-11 | Discharge: 2020-05-11 | Disposition: A | Discharge status: Home / Self Care | Payer: Self-pay | Attending: Emergency Medicine | Admitting: Emergency Medicine

## 2020-05-11 DIAGNOSIS — J45909 Unspecified asthma, uncomplicated: Secondary | ICD-10-CM | POA: Insufficient documentation

## 2020-05-11 DIAGNOSIS — J45901 Unspecified asthma with (acute) exacerbation: Secondary | ICD-10-CM | POA: Diagnosis not present

## 2020-05-11 DIAGNOSIS — R079 Chest pain, unspecified: Secondary | ICD-10-CM | POA: Insufficient documentation

## 2020-05-11 DIAGNOSIS — Z87891 Personal history of nicotine dependence: Secondary | ICD-10-CM | POA: Diagnosis not present

## 2020-05-11 DIAGNOSIS — R0602 Shortness of breath: Secondary | ICD-10-CM

## 2020-05-11 DIAGNOSIS — Z20822 Contact with and (suspected) exposure to covid-19: Secondary | ICD-10-CM | POA: Diagnosis not present

## 2020-05-11 DIAGNOSIS — Z5321 Procedure and treatment not carried out due to patient leaving prior to being seen by health care provider: Secondary | ICD-10-CM | POA: Insufficient documentation

## 2020-05-11 DIAGNOSIS — R062 Wheezing: Secondary | ICD-10-CM

## 2020-05-11 MED ORDER — AEROCHAMBER PLUS FLO-VU LARGE MISC
Status: AC
  Filled 2020-05-11: qty 1

## 2020-05-11 MED ORDER — ALBUTEROL SULFATE HFA 108 (90 BASE) MCG/ACT IN AERS
2.0000 | INHALATION_SPRAY | Freq: Once | RESPIRATORY_TRACT | Status: AC
  Administered 2020-05-11: 2 via RESPIRATORY_TRACT

## 2020-05-11 MED ORDER — EPINEPHRINE PF 1 MG/ML IJ SOLN
0.3000 mg | Freq: Once | INTRAMUSCULAR | Status: AC
  Administered 2020-05-11: 0.3 mg via INTRAMUSCULAR

## 2020-05-11 MED ORDER — METHYLPREDNISOLONE SODIUM SUCC 125 MG IJ SOLR
INTRAMUSCULAR | Status: AC
  Filled 2020-05-11: qty 2

## 2020-05-11 MED ORDER — EPINEPHRINE 1 MG/10ML IJ SOSY
PREFILLED_SYRINGE | INTRAMUSCULAR | Status: AC
  Filled 2020-05-11: qty 10

## 2020-05-11 MED ORDER — AEROCHAMBER PLUS FLO-VU LARGE MISC
1.0000 | Freq: Once | Status: AC
  Administered 2020-05-11: 1

## 2020-05-11 MED ORDER — PREDNISONE 20 MG PO TABS: 40.0000 mg | ORAL_TABLET | Freq: Every day | ORAL | 0 refills | Status: DC

## 2020-05-11 MED ORDER — HYDROCODONE-ACETAMINOPHEN 5-325 MG PO TABS
1.0000 | ORAL_TABLET | Freq: Once | ORAL | Status: AC
  Administered 2020-05-11: 1 via ORAL

## 2020-05-11 MED ORDER — ALBUTEROL SULFATE HFA 108 (90 BASE) MCG/ACT IN AERS
INHALATION_SPRAY | RESPIRATORY_TRACT | Status: AC
  Filled 2020-05-11: qty 6.7

## 2020-05-11 MED ORDER — HYDROCODONE-ACETAMINOPHEN 5-325 MG PO TABS
ORAL_TABLET | ORAL | Status: AC
  Filled 2020-05-11: qty 1

## 2020-05-11 MED ORDER — METHYLPREDNISOLONE SODIUM SUCC 125 MG IJ SOLR
125.0000 mg | Freq: Once | INTRAMUSCULAR | Status: AC
  Administered 2020-05-11: 125 mg via INTRAMUSCULAR

## 2020-05-11 NOTE — ED Triage Notes (Signed)
Pt arrived via walk in, c/o increased SOB. Hx of asthma, has been using inhaler frequently this morning and neb tx x4 with no relief. Speaking in full sentences

## 2020-05-11 NOTE — ED Triage Notes (Signed)
Pt c/o acute SOB onset this morning at 0300. Has taken approx 5 nebulizer tx and b/t 42/50 pumps of albuterol MDI since then.  Bilateral wheezes and coarse sounds throughout.  Tachypneic 32 RR.
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
## 2020-05-12 LAB — SARS CORONAVIRUS 2 (TAT 6-24 HRS): SARS Coronavirus 2: NEGATIVE

## 2020-05-26 MED ORDER — CEFAZOLIN SODIUM-DEXTROSE 2-4 GM/100ML-% IV SOLN
INTRAVENOUS | Status: AC
  Filled 2020-05-26: qty 100

## 2020-09-27 ENCOUNTER — Emergency Department (HOSPITAL_COMMUNITY)
Admission: EM | Admit: 2020-09-27 | Discharge: 2020-09-27 | Discharge status: Against Medical Advice | Payer: No Typology Code available for payment source

## 2020-09-27 ENCOUNTER — Emergency Department
Admission: EM | Admit: 2020-09-27 | Discharge: 2020-09-27 | Discharge status: Absent without leave | Payer: Medicaid Other

## 2020-09-27 ENCOUNTER — Other Ambulatory Visit: Payer: Self-pay

## 2020-09-27 ENCOUNTER — Encounter (HOSPITAL_COMMUNITY): Payer: Self-pay

## 2020-09-27 ENCOUNTER — Ambulatory Visit (HOSPITAL_COMMUNITY)
Admission: EM | Admit: 2020-09-27 | Discharge: 2020-09-27 | Disposition: A | Discharge status: Home / Self Care | Payer: No Typology Code available for payment source | Attending: Urgent Care | Admitting: Urgent Care

## 2020-09-27 DIAGNOSIS — M5442 Lumbago with sciatica, left side: Secondary | ICD-10-CM

## 2020-09-27 DIAGNOSIS — J4530 Mild persistent asthma, uncomplicated: Secondary | ICD-10-CM

## 2020-09-27 DIAGNOSIS — R000 Tachycardia, unspecified: Secondary | ICD-10-CM

## 2020-09-27 MED ORDER — ALBUTEROL SULFATE HFA 108 (90 BASE) MCG/ACT IN AERS: 1.0000 | INHALATION_SPRAY | Freq: Four times a day (QID) | RESPIRATORY_TRACT | 0 refills | Status: DC | PRN

## 2020-09-27 MED ORDER — PREDNISONE 20 MG PO TABS: 40.0000 mg | ORAL_TABLET | Freq: Every day | ORAL | 0 refills | Status: DC

## 2020-09-27 MED ORDER — TIZANIDINE HCL 4 MG PO TABS: 4.0000 mg | ORAL_TABLET | Freq: Three times a day (TID) | ORAL | 0 refills | Status: DC | PRN

## 2020-09-27 NOTE — Discharge Instructions (Addendum)
Please limit your lifting to less than 10lbs. Offload your back by using your thighs to crouch or bend. Avoid strenuous physical activity for the next week.

## 2020-09-27 NOTE — ED Triage Notes (Signed)
Pt presents with back pain radiates to left leg x 4 days. Worsens when sitting.   Requested albuterol inhaler refill.
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
## 2021-03-21 ENCOUNTER — Other Ambulatory Visit: Payer: Self-pay

## 2021-03-21 ENCOUNTER — Emergency Department (HOSPITAL_COMMUNITY)
Admission: EM | Admit: 2021-03-21 | Discharge: 2021-03-21 | Disposition: A | Discharge status: Home / Self Care | Payer: No Typology Code available for payment source | Attending: Emergency Medicine | Admitting: Emergency Medicine

## 2021-03-21 DIAGNOSIS — I10 Essential (primary) hypertension: Secondary | ICD-10-CM | POA: Insufficient documentation

## 2021-03-21 DIAGNOSIS — Z5321 Procedure and treatment not carried out due to patient leaving prior to being seen by health care provider: Secondary | ICD-10-CM | POA: Insufficient documentation

## 2021-03-21 NOTE — ED Triage Notes (Signed)
Patient states that when he was at [facility] getting a Covid booster he checked his BP and it was 189/105.

## 2021-07-30 ENCOUNTER — Emergency Department: Payer: Medicaid Other

## 2021-07-30 ENCOUNTER — Other Ambulatory Visit: Payer: Self-pay

## 2021-07-30 ENCOUNTER — Encounter: Payer: Self-pay | Admitting: Emergency Medicine

## 2021-07-30 ENCOUNTER — Emergency Department
Admission: EM | Admit: 2021-07-30 | Discharge: 2021-07-30 | Disposition: A | Discharge status: Home / Self Care | Payer: Medicaid Other | Attending: Emergency Medicine | Admitting: Emergency Medicine

## 2021-07-30 DIAGNOSIS — M7989 Other specified soft tissue disorders: Secondary | ICD-10-CM | POA: Insufficient documentation

## 2021-07-30 DIAGNOSIS — J45909 Unspecified asthma, uncomplicated: Secondary | ICD-10-CM | POA: Insufficient documentation

## 2021-07-30 DIAGNOSIS — M25562 Pain in left knee: Secondary | ICD-10-CM | POA: Insufficient documentation

## 2021-07-30 DIAGNOSIS — Z87891 Personal history of nicotine dependence: Secondary | ICD-10-CM | POA: Insufficient documentation

## 2021-07-30 MED ORDER — ONDANSETRON 4 MG PO TBDP
4.0000 mg | ORAL_TABLET | Freq: Once | ORAL | Status: AC
  Administered 2021-07-30: 20:00:00 4 mg via ORAL
  Filled 2021-07-30: qty 1

## 2021-07-30 MED ORDER — HYDROCODONE-ACETAMINOPHEN 5-325 MG PO TABS
1.0000 | ORAL_TABLET | Freq: Once | ORAL | Status: AC
  Administered 2021-07-30: 20:00:00 1 via ORAL
  Filled 2021-07-30: qty 1

## 2021-07-30 MED ORDER — MELOXICAM 15 MG PO TABS: 15.0000 mg | ORAL_TABLET | Freq: Every day | ORAL | 2 refills | Status: AC

## 2021-07-30 MED ORDER — ONDANSETRON HCL 4 MG/2ML IJ SOLN: 4.0000 mg | Freq: Once | INTRAMUSCULAR | Status: DC

## 2021-07-30 MED ORDER — MELOXICAM 15 MG PO TABS: 15.0000 mg | ORAL_TABLET | Freq: Every day | ORAL | 2 refills | Status: DC

## 2021-07-30 NOTE — ED Triage Notes (Signed)
Pt via POV from home. Pt c/o L knee pain that has progressively gotten worse over the past 3 days. States that it is painful when he bends it. Denies any injury. Ambulatory to triage. No swelling noted. Pt A&OX4 and NAD.
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
## 2021-07-30 NOTE — Discharge Instructions (Addendum)
Take Meloxicam once daily for pain and inflmammation.

## 2021-11-04 ENCOUNTER — Emergency Department (HOSPITAL_BASED_OUTPATIENT_CLINIC_OR_DEPARTMENT_OTHER)
Admission: EM | Admit: 2021-11-04 | Discharge: 2021-11-04 | Disposition: A | Discharge status: Home / Self Care | Payer: Medicaid Other | Attending: Emergency Medicine | Admitting: Emergency Medicine

## 2021-11-04 ENCOUNTER — Encounter (HOSPITAL_BASED_OUTPATIENT_CLINIC_OR_DEPARTMENT_OTHER): Payer: Self-pay

## 2021-11-04 ENCOUNTER — Other Ambulatory Visit: Payer: Self-pay

## 2021-11-04 ENCOUNTER — Emergency Department (HOSPITAL_BASED_OUTPATIENT_CLINIC_OR_DEPARTMENT_OTHER): Payer: Medicaid Other

## 2021-11-04 DIAGNOSIS — Z7982 Long term (current) use of aspirin: Secondary | ICD-10-CM | POA: Insufficient documentation

## 2021-11-04 DIAGNOSIS — M25562 Pain in left knee: Secondary | ICD-10-CM | POA: Insufficient documentation

## 2021-11-04 MED ORDER — KETOROLAC TROMETHAMINE 60 MG/2ML IM SOLN
60.0000 mg | Freq: Once | INTRAMUSCULAR | Status: AC
  Administered 2021-11-04: 60 mg via INTRAMUSCULAR
  Filled 2021-11-04: qty 2

## 2021-11-04 MED ORDER — OXYCODONE-ACETAMINOPHEN 5-325 MG PO TABS
1.0000 | ORAL_TABLET | Freq: Once | ORAL | Status: AC
  Administered 2021-11-04: 1 via ORAL
  Filled 2021-11-04: qty 1

## 2021-11-04 MED ORDER — OXYCODONE HCL 5 MG PO TABS: 5.0000 mg | ORAL_TABLET | Freq: Four times a day (QID) | ORAL | 0 refills | Status: DC | PRN

## 2021-11-04 MED ORDER — ALBUTEROL SULFATE HFA 108 (90 BASE) MCG/ACT IN AERS
1.0000 | INHALATION_SPRAY | Freq: Once | RESPIRATORY_TRACT | Status: AC
  Administered 2021-11-04: 1 via RESPIRATORY_TRACT
  Filled 2021-11-04: qty 6.7

## 2021-11-04 MED ORDER — PREDNISONE 20 MG PO TABS: 40.0000 mg | ORAL_TABLET | Freq: Every day | ORAL | 0 refills | Status: AC

## 2021-11-04 NOTE — ED Provider Notes (Signed)
?[facility] EM[facility]PT ?Provider Note ? ? ?CSN: [id number] ?Arrival date & time: 11/04/21  4270 ? ?  ? ?History ? ?Chief Complaint  ?Patient presents with  ? Knee Pain  ? ? ?William Kane is a 41 y.o. male. ? ?The history is provided by the patient.  ?Knee Pain ?Location:  Knee ?Time since incident:  1 day ?Injury: no   ?Knee location:  L knee ?Pain details:  ?  Severity:  Moderate ?  Onset quality:  Gradual ?  Duration:  1 day ?  Timing:  Constant ?  Progression:  Unchanged ?Chronicity:  Recurrent ?Prior injury to area:  No ?Relieved by:  Nothing ?Worsened by:  Bearing weight ?Associated symptoms: no back pain, no decreased ROM, no fatigue, no fever, no itching, no muscle weakness, no neck pain, no numbness, no stiffness, no swelling and no tingling   ? ?  ? ?Home Medications ?Prior to Admission medications   ?Medication Sig Start Date End Date Taking? Authorizing Provider  ?oxyCODONE (ROXICODONE) 5 MG immediate release tablet Take 1 tablet (5 mg total) by mouth every 6 (six) hours as needed for up to 10 doses for breakthrough pain. 11/04/21  Yes Waverly Chavarria, DO  ?predniSONE (DELTASONE) 20 MG tablet Take 2 tablets (40 mg total) by mouth daily for 5 days. 11/04/21 11/09/21 Yes Dare Spillman, DO  ?acetaminophen (TYLENOL) 500 MG tablet Take 1,000 mg by mouth every 6 (six) hours as needed for mild pain or headache.    [provider]  ?albuterol (ACCUNEB) 1.25 MG/3ML nebulizer solution Take 1 ampule by nebulization every 6 (six) hours as needed for wheezing.    [provider]  ?albuterol (VENTOLIN HFA) 108 (90 Base) MCG/ACT inhaler Inhale 1-2 puffs into the lungs every 6 (six) hours as needed for wheezing or shortness of breath. 09/27/20   Wallis Bamberg, PA-C  ?aspirin-acetaminophen-caffeine (EXCEDRIN MIGRAINE) [phone] MG tablet Take 2 tablets by mouth every 6 (six) hours as needed for headache.    [provider]  ?Aspirin-Salicylamide-Caffeine (BC HEADACHE PO) Take 2  packets by mouth daily as needed (pain).    [provider]  ?gabapentin (NEURONTIN) 300 MG capsule Take 1 capsule (300 mg total) by mouth 3 (three) times daily. 3 times a day when necessary neuropathy pain 11/15/16   Nadara Mustard, MD  ?prochlorperazine (COMPAZINE) 10 MG tablet Take 1 tablet (10 mg total) by mouth 2 (two) times daily as needed for nausea. 05/05/18   Sabas Sous, MD  ?   ? ?Allergies    ?Naproxen   ? ?Review of Systems   ?Review of Systems  ?Constitutional:  Negative for fatigue and fever.  ?Musculoskeletal:  Negative for back pain, neck pain and stiffness.  ?Skin:  Negative for itching.  ? ?Physical Exam ?Updated Vital Signs ?BP (!) 154/103 (BP Location: Right Arm)   Pulse (!) 102   Temp 98.3 ?F (36.8 ?C) (Oral)   Resp 18   SpO2 100%  ?Physical Exam ?Constitutional:   ?   General: He is not in acute distress. ?   Appearance: He is not ill-appearing.  ?Cardiovascular:  ?   Pulses: Normal pulses.  ?Musculoskeletal:     ?   General: Tenderness present. No swelling or deformity. Normal range of motion.  ?   Comments: Tenderness to the suprapatellar portion of the left knee, there is no major swelling, redness, warmth.  I am able to range the left knee without much resistance  ?Skin: ?  General: Skin is warm.  ?   Capillary Refill: Capillary refill takes less than 2 seconds.  ?Neurological:  ?   General: No focal deficit present.  ?   Mental Status: He is alert.  ?   Sensory: No sensory deficit.  ?   Motor: No weakness.  ? ? ?ED Results / Procedures / Treatments   ?Labs ?(all labs ordered are listed, but only abnormal results are displayed) ?Labs Reviewed - No data to display ? ?EKG ?None ? ?Radiology ?DG Knee Complete 4 Views Left ? ?Result Date: 11/04/2021 ?CLINICAL DATA:  41 year old male with history of left knee pain. EXAM: LEFT KNEE - COMPLETE 4+ VIEW COMPARISON:  Left knee radiograph 07/30/2021. FINDINGS: No evidence of fracture, dislocation, or joint effusion. No evidence of  arthropathy or other focal bone abnormality. Soft tissues are unremarkable. IMPRESSION: Negative. Electronically Signed   By: Trudie Reed M.D.   On: 11/04/2021 09:54   ? ?Procedures ?Procedures  ? ? ?Medications Ordered in ED ?Medications  ?albuterol (VENTOLIN HFA) 108 (90 Base) MCG/ACT inhaler 1 puff (has no administration in time range)  ?oxyCODONE-acetaminophen (PERCOCET/ROXICET) 5-325 MG per tablet 1 tablet (1 tablet Oral Given 11/04/21 0848)  ?ketorolac (TORADOL) injection 60 mg (60 mg Intramuscular Given 11/04/21 0848)  ? ? ?ED Course/ Medical Decision Making/ A&P ?  ?                        ?Medical Decision Making ?Amount and/or Complexity of Data Reviewed ?Radiology: ordered. ? ?Risk ?Prescription drug management. ? ? ?William Kane is here with left knee pain.  History of the same.  Pain for the last day or 2.  Increased frequency at gym likely because of the discomfort.  Has had x-rays and DVT study in the past for similar pain that were unremarkable.  He is tender in the suprapatellar area and overall suspect that this is some sort of tendinitis/inflammation/overuse.  There is no obvious laxity of the knee joint.  There is no obvious swelling, warmth or erythema.  Differential diagnosis is likely tendinitis versus inflammatory process.  This is less likely gout.  I have no concern for septic joint.  X-ray of the left knee per my review and interpretation showed no acute fracture or malalignment.  No major effusion.  Patient given Toradol, oxycodone with some improvement.  Will give crutches and weightbearing as tolerated.  Will prescribe Roxicodone for breakthrough pain.  Recommend Tylenol, ibuprofen.  Will prescribe steroids.  We will have him follow-up with sports medicine.  Neurovascular neuromuscularly intact on exam.  Normal strength and sensation and pulses in the lower extremity.  No concern for DVT or peripheral arterial disease. ? ?This chart was dictated using voice recognition software.   Despite best efforts to proofread,  errors can occur which can change the documentation meaning.  ? ? ? ? ? ? ? ?Final Clinical Impression(s) / ED Diagnoses ?Final diagnoses:  ?Acute pain of left knee  ? ? ?Rx / DC Orders ?ED Discharge Orders   ? ?      Ordered  ?  oxyCODONE (ROXICODONE) 5 MG immediate release tablet  Every 6 hours PRN       ? 11/04/21 0845  ?  predniSONE (DELTASONE) 20 MG tablet  Daily       ? 11/04/21 0845  ? ?  ?  ? ?  ? ? ?  ?Virgina Norfolk, DO ?11/04/21 1039 ? ?

## 2021-11-04 NOTE — ED Triage Notes (Signed)
Pt presents with Left knee pain x2 days. Hx of the same in the past. No relief with ice and OTC meds. Unable to apply pressure  ?

## 2021-11-04 NOTE — Discharge Instructions (Signed)
X-ray showed no fracture or dislocation.  Overall suspect that you have an inflammatory knee process.  Likely tendinitis.  Recommend 1000 mg of Tylenol every 6 hours as needed for pain.  Recommend 800 mg ibuprofen every 8 hours as needed for pain.  Take Roxicodone for breakthrough pain.  This is a narcotic pain medicine.  Do not use with alcohol or drugs or dangerous activities including driving.  Follow-up with sports medicine.  Recommend ice.  Activity as tolerated with crutches. ?

## 2021-11-07 ENCOUNTER — Emergency Department (HOSPITAL_BASED_OUTPATIENT_CLINIC_OR_DEPARTMENT_OTHER): Payer: Medicaid Other

## 2021-11-07 ENCOUNTER — Emergency Department (HOSPITAL_BASED_OUTPATIENT_CLINIC_OR_DEPARTMENT_OTHER)
Admission: EM | Admit: 2021-11-07 | Discharge: 2021-11-08 | Disposition: A | Discharge status: Home / Self Care | Payer: Medicaid Other | Attending: Emergency Medicine | Admitting: Emergency Medicine

## 2021-11-07 ENCOUNTER — Other Ambulatory Visit: Payer: Self-pay

## 2021-11-07 ENCOUNTER — Encounter (HOSPITAL_BASED_OUTPATIENT_CLINIC_OR_DEPARTMENT_OTHER): Payer: Self-pay

## 2021-11-07 DIAGNOSIS — S92351A Displaced fracture of fifth metatarsal bone, right foot, initial encounter for closed fracture: Secondary | ICD-10-CM

## 2021-11-07 DIAGNOSIS — S92354A Nondisplaced fracture of fifth metatarsal bone, right foot, initial encounter for closed fracture: Secondary | ICD-10-CM | POA: Insufficient documentation

## 2021-11-07 DIAGNOSIS — X500XXA Overexertion from strenuous movement or load, initial encounter: Secondary | ICD-10-CM | POA: Insufficient documentation

## 2021-11-07 DIAGNOSIS — R030 Elevated blood-pressure reading, without diagnosis of hypertension: Secondary | ICD-10-CM | POA: Insufficient documentation

## 2021-11-07 DIAGNOSIS — J45909 Unspecified asthma, uncomplicated: Secondary | ICD-10-CM | POA: Insufficient documentation

## 2021-11-07 NOTE — ED Triage Notes (Signed)
Patient here POV from Home with Foot Injury. ? ?Patient endorses "Rolling" his Right Foot approximately 3 hours PTA.  ? ?No Fall. No Other Injuries. Pulses Palpable.  ? ?NAD Noted during Triage. A&Ox4. GCS 15. BIB Wheelchair. ?

## 2021-11-08 MED ORDER — HYDROCHLOROTHIAZIDE 25 MG PO TABS
25.0000 mg | ORAL_TABLET | Freq: Once | ORAL | Status: AC
Start: 2021-11-08 — End: 2021-11-08
  Administered 2021-11-08: 25 mg via ORAL
  Filled 2021-11-08: qty 1

## 2021-11-08 MED ORDER — HYDROCHLOROTHIAZIDE 25 MG PO TABS: 25.0000 mg | ORAL_TABLET | Freq: Every day | ORAL | 0 refills | Status: DC

## 2021-11-08 MED ORDER — OXYCODONE-ACETAMINOPHEN 5-325 MG PO TABS
1.0000 | ORAL_TABLET | Freq: Once | ORAL | Status: AC
  Administered 2021-11-08: 1 via ORAL
  Filled 2021-11-08: qty 1

## 2021-11-08 MED ORDER — OXYCODONE HCL 5 MG PO TABS: 5.0000 mg | ORAL_TABLET | Freq: Four times a day (QID) | ORAL | 0 refills | Status: DC | PRN
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
## 2021-11-08 NOTE — Discharge Instructions (Signed)
Apply ice for 30 minutes at a time, 4 times a day. ? ?Keep your foot elevated is much as possible. ? ?Wear the boot at all times except when bathing. ? ?No weightbearing until cleared by orthopedics.  In the meantime, use the crutches. ? ?For pain, you may take ibuprofen or naproxen.  To get additional pain relief, add acetaminophen.  When you combine ibuprofen or naproxen plus acetaminophen, you will get better pain relief than you get from taking either medication by itself.  Reserve oxycodone for pain not controlled by acetaminophen plus either naproxen or ibuprofen. ? ?Please make a follow-up appointment with one of the orthopedic physicians. ? ?Your blood pressure was high today and has been high on multiple occasions.  You have been started on medication.  I recommend that you check your blood pressure once a day on a regular basis and keep a record of it.  You will need to obtain a primary care provider.  When you see your primary care provider, bring the record of your blood pressure readings in with you.  If you are unable to get an appointment with a primary care provider and the prescription is running out, please go to an urgent care center to have the prescription refilled. ?

## 2021-11-15 ENCOUNTER — Ambulatory Visit (HOSPITAL_BASED_OUTPATIENT_CLINIC_OR_DEPARTMENT_OTHER): Payer: Self-pay | Admitting: Orthopaedic Surgery

## 2021-11-17 ENCOUNTER — Other Ambulatory Visit: Payer: Self-pay

## 2021-11-17 ENCOUNTER — Ambulatory Visit (INDEPENDENT_AMBULATORY_CARE_PROVIDER_SITE_OTHER): Payer: Self-pay | Admitting: Orthopaedic Surgery

## 2021-11-17 DIAGNOSIS — S93411A Sprain of calcaneofibular ligament of right ankle, initial encounter: Secondary | ICD-10-CM
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
## 2022-01-04 ENCOUNTER — Emergency Department (HOSPITAL_BASED_OUTPATIENT_CLINIC_OR_DEPARTMENT_OTHER)
Admission: EM | Admit: 2022-01-04 | Discharge: 2022-01-04 | Disposition: A | Discharge status: Home / Self Care | Payer: Medicaid Other | Attending: Emergency Medicine | Admitting: Emergency Medicine

## 2022-01-04 ENCOUNTER — Encounter (HOSPITAL_BASED_OUTPATIENT_CLINIC_OR_DEPARTMENT_OTHER): Payer: Self-pay | Admitting: Obstetrics and Gynecology

## 2022-01-04 ENCOUNTER — Emergency Department (HOSPITAL_BASED_OUTPATIENT_CLINIC_OR_DEPARTMENT_OTHER): Payer: Medicaid Other | Admitting: Radiology

## 2022-01-04 ENCOUNTER — Other Ambulatory Visit: Payer: Self-pay

## 2022-01-04 DIAGNOSIS — Z20822 Contact with and (suspected) exposure to covid-19: Secondary | ICD-10-CM | POA: Insufficient documentation

## 2022-01-04 DIAGNOSIS — J4521 Mild intermittent asthma with (acute) exacerbation: Secondary | ICD-10-CM | POA: Insufficient documentation

## 2022-01-04 DIAGNOSIS — F17210 Nicotine dependence, cigarettes, uncomplicated: Secondary | ICD-10-CM | POA: Insufficient documentation

## 2022-01-04 DIAGNOSIS — T7840XA Allergy, unspecified, initial encounter: Secondary | ICD-10-CM | POA: Insufficient documentation

## 2022-01-04 DIAGNOSIS — Z79899 Other long term (current) drug therapy: Secondary | ICD-10-CM | POA: Insufficient documentation

## 2022-01-04 DIAGNOSIS — Z7982 Long term (current) use of aspirin: Secondary | ICD-10-CM | POA: Insufficient documentation

## 2022-01-04 LAB — CBC
HCT: 47.2 % (ref 39.0–52.0)
Hemoglobin: 15.7 g/dL (ref 13.0–17.0)
MCH: 31.7 pg (ref 26.0–34.0)
MCHC: 33.3 g/dL (ref 30.0–36.0)
MCV: 95.4 fL (ref 80.0–100.0)
Platelets: 122 10*3/uL — ABNORMAL LOW (ref 150–400)
RBC: 4.95 MIL/uL (ref 4.22–5.81)
RDW: 13.2 % (ref 11.5–15.5)
WBC: 4.3 10*3/uL (ref 4.0–10.5)
nRBC: 0 % (ref 0.0–0.2)

## 2022-01-04 LAB — RESP PANEL BY RT-PCR (FLU A&B, COVID) ARPGX2
Influenza A by PCR: NEGATIVE
Influenza B by PCR: NEGATIVE
SARS Coronavirus 2 by RT PCR: NEGATIVE

## 2022-01-04 LAB — BASIC METABOLIC PANEL
Anion gap: 10 (ref 5–15)
BUN: 8 mg/dL (ref 6–20)
CO2: 24 mmol/L (ref 22–32)
Calcium: 9 mg/dL (ref 8.9–10.3)
Chloride: 105 mmol/L (ref 98–111)
Creatinine, Ser: 0.86 mg/dL (ref 0.61–1.24)
GFR, Estimated: >60 mL/min (ref >60)
Glucose, Bld: 157 mg/dL — ABNORMAL HIGH (ref 70–99)
Potassium: 3.6 mmol/L (ref 3.5–5.1)
Sodium: 139 mmol/L (ref 135–145)

## 2022-01-04 LAB — TROPONIN I (HIGH SENSITIVITY)
Troponin I (High Sensitivity): 8 ng/L (ref <18)
Troponin I (High Sensitivity): 8 ng/L (ref <18)

## 2022-01-04 MED ORDER — OXYCODONE-ACETAMINOPHEN 5-325 MG PO TABS
1.0000 | ORAL_TABLET | Freq: Once | ORAL | Status: AC
  Administered 2022-01-04: 1 via ORAL
  Filled 2022-01-04: qty 1

## 2022-01-04 MED ORDER — IPRATROPIUM-ALBUTEROL 0.5-2.5 (3) MG/3ML IN SOLN
3.0000 mL | Freq: Once | RESPIRATORY_TRACT | Status: AC
  Administered 2022-01-04: 3 mL via RESPIRATORY_TRACT
  Filled 2022-01-04: qty 3

## 2022-01-04 MED ORDER — HYDROCHLOROTHIAZIDE 25 MG PO TABS: 25.0000 mg | ORAL_TABLET | Freq: Every day | ORAL | 0 refills | Status: DC

## 2022-01-04 MED ORDER — ALBUTEROL SULFATE HFA 108 (90 BASE) MCG/ACT IN AERS
2.0000 | INHALATION_SPRAY | Freq: Once | RESPIRATORY_TRACT | Status: AC
  Administered 2022-01-04: 2 via RESPIRATORY_TRACT
  Filled 2022-01-04: qty 6.7

## 2022-01-04 MED ORDER — DEXAMETHASONE SODIUM PHOSPHATE 10 MG/ML IJ SOLN
10.0000 mg | Freq: Once | INTRAMUSCULAR | Status: AC
  Administered 2022-01-04: 10 mg via INTRAMUSCULAR
  Filled 2022-01-04: qty 1

## 2022-01-04 NOTE — ED Provider Notes (Signed)
?[facility] EMERGENCY DEPT ?Provider Note ? ? ?CSN: [id number] ?Arrival date & time: 01/04/22  1105 ? ?  ? ?History ? ?Chief Complaint  ?Patient presents with  ? Chest Pain  ? Nasal Congestion  ? ? ?William Kane is a 41 y.o. male with history of asthma who presents to the ED for evaluation of chest congestion and shortness of breath.  Patient states that symptoms started about 1 week ago which she suspects is from allergies.  However he took montelukast for a few days without any relief.  He then switched to Allegra and symptoms continue to worsen.  He has been using his albuterol nebulizer once or twice a day for the last several days and he continues to feel wheezy with chest discomfort described as tightness.  He also endorses occasionally productive cough with white phlegm.  No previous history of cardiac disease.  He reports fever of 101 ?F yesterday.  Did not use Tylenol or Motrin and it resolved spontaneously.  He denies nasal congestion, abdominal pain, nausea, vomiting.  He notes that he has an appointment on June 6 to establish primary care. ? ? ?Chest Pain ? ?  ? ?Home Medications ?Prior to Admission medications   ?Medication Sig Start Date End Date Taking? Authorizing Provider  ?albuterol (ACCUNEB) 1.25 MG/3ML nebulizer solution Take 1 ampule by nebulization every 6 (six) hours as needed for wheezing.   Yes [provider]  ?albuterol (VENTOLIN HFA) 108 (90 Base) MCG/ACT inhaler Inhale 1-2 puffs into the lungs every 6 (six) hours as needed for wheezing or shortness of breath. 09/27/20  Yes Wallis Bamberg, PA-C  ?gabapentin (NEURONTIN) 300 MG capsule Take 1 capsule (300 mg total) by mouth 3 (three) times daily. 3 times a day when necessary neuropathy pain 11/15/16  Yes Nadara Mustard, MD  ?hydrochlorothiazide (HYDRODIURIL) 25 MG tablet Take 1 tablet (25 mg total) by mouth daily. 11/08/21  Yes Dione Booze, MD  ?hydrochlorothiazide (HYDRODIURIL) 25 MG tablet Take 1 tablet (25 mg total) by  mouth daily. 01/04/22  Yes Raynald Blend R, PA-C  ?oxyCODONE (ROXICODONE) 5 MG immediate release tablet Take 1 tablet (5 mg total) by mouth every 6 (six) hours as needed for up to 20 doses for breakthrough pain. 11/08/21  Yes Dione Booze, MD  ?acetaminophen (TYLENOL) 500 MG tablet Take 1,000 mg by mouth every 6 (six) hours as needed for mild pain or headache.    [provider]  ?aspirin-acetaminophen-caffeine (EXCEDRIN MIGRAINE) [phone] MG tablet Take 2 tablets by mouth every 6 (six) hours as needed for headache.    [provider]  ?Aspirin-Salicylamide-Caffeine (BC HEADACHE PO) Take 2 packets by mouth daily as needed (pain).    [provider]  ?prochlorperazine (COMPAZINE) 10 MG tablet Take 1 tablet (10 mg total) by mouth 2 (two) times daily as needed for nausea. ?Patient not taking: Reported on 01/04/2022 05/05/18   Sabas Sous, MD  ?   ? ?Allergies    ?Naproxen   ? ?Review of Systems   ?Review of Systems  ?Cardiovascular:  Positive for chest pain.  ? ?Physical Exam ?Updated Vital Signs ?BP (!) 139/99   Pulse 96   Temp 98.2 ?F (36.8 ?C)   Resp 14   Ht 5\' 10"  (1.778 m)   Wt 102.7 kg   SpO2 97%   BMI 32.49 kg/m?  ?Physical Exam ?Vitals and nursing note reviewed.  ?Constitutional:   ?   General: He is not in acute distress. ?  Appearance: He is not ill-appearing.  ?HENT:  ?   Head: Atraumatic.  ?Eyes:  ?   Conjunctiva/sclera: Conjunctivae normal.  ?Cardiovascular:  ?   Rate and Rhythm: Normal rate and regular rhythm.  ?   Pulses: Normal pulses.  ?   Heart sounds: No murmur heard. ?Pulmonary:  ?   Effort: Pulmonary effort is normal. No respiratory distress.  ?   Breath sounds: Normal breath sounds.  ?   Comments: Speaking in full complete sentences.  Diffuse expiratory wheezing in all lung fields. ?Abdominal:  ?   General: Abdomen is flat. There is no distension.  ?   Palpations: Abdomen is soft.  ?   Tenderness: There is no abdominal tenderness.  ?Musculoskeletal:     ?    General: Normal range of motion.  ?   Cervical back: Normal range of motion.  ?Skin: ?   General: Skin is warm and dry.  ?   Capillary Refill: Capillary refill takes less than 2 seconds.  ?Neurological:  ?   General: No focal deficit present.  ?   Mental Status: He is alert.  ?Psychiatric:     ?   Mood and Affect: Mood normal.  ? ? ?ED Results / Procedures / Treatments   ?Labs ?(all labs ordered are listed, but only abnormal results are displayed) ?Labs Reviewed  ?BASIC METABOLIC PANEL - Abnormal; Notable for the following components:  ?    Result Value  ? Glucose, Bld 157 (*)   ? All other components within normal limits  ?CBC - Abnormal; Notable for the following components:  ? Platelets 122 (*)   ? All other components within normal limits  ?RESP PANEL BY RT-PCR (FLU A&B, COVID) [id number]  ?TROPONIN I (HIGH SENSITIVITY)  ?TROPONIN I (HIGH SENSITIVITY)  ? ? ?EKG ?None ? ?Radiology ?DG Chest 2 View ? ?Result Date: 01/04/2022 ?CLINICAL DATA:  Chest pain. EXAM: CHEST - 2 VIEW COMPARISON:  May 11, 2020. FINDINGS: The heart size and mediastinal contours are within normal limits. Both lungs are clear. The visualized skeletal structures are unremarkable. IMPRESSION: No active cardiopulmonary disease. Electronically Signed   By: Lupita Raider M.D.   On: 01/04/2022 11:38   ? ?Procedures ?Procedures  ? ?Medications Ordered in ED ?Medications  ?ipratropium-albuterol (DUONEB) 0.5-2.5 (3) MG/3ML nebulizer solution 3 mL (3 mLs Nebulization Given 01/04/22 1238)  ?dexamethasone (DECADRON) injection 10 mg (10 mg Intramuscular Given 01/04/22 1242)  ?albuterol (VENTOLIN HFA) 108 (90 Base) MCG/ACT inhaler 2 puff (2 puffs Inhalation Given 01/04/22 1238)  ?oxyCODONE-acetaminophen (PERCOCET/ROXICET) 5-325 MG per tablet 1 tablet (1 tablet Oral Given 01/04/22 1401)  ? ? ?ED Course/ Medical Decision Making/ A&P ?  ?                        ?Medical Decision Making ?Amount and/or Complexity of Data Reviewed ?Labs: ordered. ?Radiology:  ordered. ? ?Risk ?Prescription drug management. ? ? ?Social determinants of health:  ?Social History  ? ?Socioeconomic History  ? Marital status: Single  ?  Spouse name: Not on file  ? Number of children: Not on file  ? Years of education: Not on file  ? Highest education level: Not on file  ?Occupational History  ? Not on file  ?Tobacco Use  ? Smoking status: Every Day  ?  Packs/day: 0.50  ?  Types: Cigarettes  ?  Last attempt to quit: 02/12/2015  ?  Years since quitting: 6.8  ? Smokeless tobacco: Never  ?  Vaping Use  ? Vaping Use: Never used  ?Substance and Sexual Activity  ? Alcohol use: Yes  ?  Comment: occ  ? Drug use: Yes  ?  Frequency: 7.0 times per week  ?  Types: Marijuana  ?  Comment: occ  ? Sexual activity: Not on file  ?Other Topics Concern  ? Not on file  ?Social History Narrative  ? Not on file  ? ?Social Determinants of Health  ? ?Financial Resource Strain: Not on file  ?Food Insecurity: Not on file  ?Transportation Needs: Not on file  ?Physical Activity: Not on file  ?Stress: Not on file  ?Social Connections: Not on file  ?Intimate Partner Violence: Not on file  ? ? ? ?Initial impression: ? ?This patient presents to the ED for concern of chest pain and wheezing, this involves an extensive number of treatment options, and is a complaint that carries with it a high risk of complications and morbidity.   Differentials include ACS, asthma exacerbation, COPD, PE, CHF, viral URI, pneumonia.  ? ?Comorbidities affecting care:  ?Asthma ? ?Lab Tests ? ?I Ordered, reviewed, and interpreted labs and EKG.  The pertinent results include:  ?BMP and CBC without acute findings ?Respiratory panel normal ?Troponin normal ? ?Imaging Studies ordered: ? ?I ordered imaging studies including  ?Chest x-ray without acute findings ?I independently visualized and interpreted imaging and I agree with the radiologist interpretation.  ? ?EKG: ?NSR ? ?Cardiac Monitoring: ? ?The patient was maintained on a cardiac monitor.  I  personally viewed and interpreted the cardiac monitored which showed an underlying rhythm of: NSR ? ? ?Medicines ordered and prescription drug management: ? ?I ordered medication including: ?Decadron  ?Duoneb ?Albuterol in

## 2022-01-04 NOTE — ED Triage Notes (Signed)
Patient reports to the ER for chest congestion and pain. Reports he has concerns for his cholesterol being high. States he has a PCP apt coming up to get established but has run out of his albuterol and HCTZ ?

## 2022-01-04 NOTE — ED Notes (Signed)
RT at the Bedside. ?

## 2022-01-04 NOTE — Discharge Instructions (Signed)
Your symptoms are secondary to an acute asthma exacerbation.  Continue using your inhaler and nebulizer at home for your symptoms.  This is likely brought on by seasonal allergies, so newly until you can follow-up with your primary care doctor.  I also given you a shot of steroids which should stay in your system for several days and that will also help calm things down and improve your wheezing. ?

## 2022-01-04 NOTE — ED Notes (Signed)
Patient educated on MDI with and without spacer. Patient demonstrated good effort and technique.  ?

## 2022-02-02 NOTE — Progress Notes (Signed)
Erroneous encounter-disregard

## 2022-02-09 ENCOUNTER — Encounter: Payer: Self-pay | Admitting: Family

## 2022-02-09 DIAGNOSIS — Z7689 Persons encountering health services in other specified circumstances: Secondary | ICD-10-CM

## 2022-04-02 ENCOUNTER — Ambulatory Visit: Payer: Self-pay | Admitting: Family Medicine

## 2022-04-03 ENCOUNTER — Ambulatory Visit: Payer: Self-pay | Admitting: Family Medicine

## 2022-06-21 ENCOUNTER — Ambulatory Visit (INDEPENDENT_AMBULATORY_CARE_PROVIDER_SITE_OTHER): Payer: Self-pay | Admitting: Family Medicine

## 2022-06-21 ENCOUNTER — Other Ambulatory Visit: Payer: Self-pay

## 2022-06-21 ENCOUNTER — Encounter: Payer: Self-pay | Admitting: Family Medicine

## 2022-06-21 VITALS — BP 132/89 | Temp 98.0°F | Resp 16 | Ht 68.0 in | Wt 224.0 lb

## 2022-06-21 DIAGNOSIS — Z23 Encounter for immunization: Secondary | ICD-10-CM

## 2022-06-21 DIAGNOSIS — I10 Essential (primary) hypertension: Secondary | ICD-10-CM

## 2022-06-21 DIAGNOSIS — F411 Generalized anxiety disorder: Secondary | ICD-10-CM

## 2022-06-21 DIAGNOSIS — Z7689 Persons encountering health services in other specified circumstances: Secondary | ICD-10-CM

## 2022-06-21 DIAGNOSIS — J4520 Mild intermittent asthma, uncomplicated: Secondary | ICD-10-CM

## 2022-06-21 MED ORDER — PAROXETINE HCL 20 MG PO TABS: 20.0000 mg | ORAL_TABLET | Freq: Every day | ORAL | 0 refills | Status: DC

## 2022-06-21 MED ORDER — HYDROCHLOROTHIAZIDE 25 MG PO TABS: 25.0000 mg | ORAL_TABLET | Freq: Every day | ORAL | 1 refills | Status: DC

## 2022-06-21 MED ORDER — ALBUTEROL SULFATE HFA 108 (90 BASE) MCG/ACT IN AERS
1.0000 | INHALATION_SPRAY | Freq: Four times a day (QID) | RESPIRATORY_TRACT | 2 refills | Status: DC | PRN
  Filled 2022-06-21 – 2023-01-30 (×3): qty 6.7, 25d supply, fill #0

## 2022-06-21 NOTE — Progress Notes (Signed)
.  Pt presents to establish care,  -hasn't seen PCP since 2015-2016 -needs HCTZ and Albuterol inhaler  -needs something for anxiety

## 2022-06-25 ENCOUNTER — Encounter: Payer: Self-pay | Admitting: Family Medicine
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
## 2022-06-28 ENCOUNTER — Other Ambulatory Visit: Payer: Self-pay

## 2022-12-24 ENCOUNTER — Telehealth (HOSPITAL_COMMUNITY): Payer: Self-pay | Admitting: Licensed Clinical Social Worker
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
## 2022-12-30 ENCOUNTER — Ambulatory Visit (HOSPITAL_COMMUNITY)
Admission: EM | Admit: 2022-12-30 | Discharge: 2022-12-30 | Disposition: A | Discharge status: Home / Self Care | Payer: No Payment, Other | Attending: Behavioral Health | Admitting: Behavioral Health

## 2022-12-30 ENCOUNTER — Encounter (HOSPITAL_COMMUNITY): Payer: Self-pay | Admitting: Psychiatry

## 2022-12-30 DIAGNOSIS — F1010 Alcohol abuse, uncomplicated: Secondary | ICD-10-CM | POA: Insufficient documentation

## 2022-12-30 DIAGNOSIS — F1994 Other psychoactive substance use, unspecified with psychoactive substance-induced mood disorder: Secondary | ICD-10-CM | POA: Insufficient documentation

## 2022-12-30 DIAGNOSIS — F1090 Alcohol use, unspecified, uncomplicated: Secondary | ICD-10-CM

## 2022-12-30 DIAGNOSIS — F419 Anxiety disorder, unspecified: Secondary | ICD-10-CM | POA: Insufficient documentation

## 2022-12-30 NOTE — ED Provider Notes (Signed)
Behavioral Health Urgent Care Medical Screening Exam  Patient Name: William Kane MRN: [id number] Date of Evaluation: 12/30/22 Chief Complaint:  alcohol use  Diagnosis:  Final diagnoses:  Alcohol use disorder  Substance induced mood disorder (HCC)    History of Present illness: William Kane is a 42 y.o. male patient with a documented history of anxiety who presents to the [facility] behavioral health urgent care voluntary accompanied by his wife seeking treatment options for alcohol use.  Patient seen and evaluated face-to-face by this provider, and chart reviewed. Per chart review, patient noted to have a documented history of anxiety from encounter 06/21/2022, seen by his PCP.   On evaluation, patient is alert and oriented x 4. His thought process is linear and speech is clear and coherent. His mood is euthymic and affect is congruent. He has fair eye contact. He is calm and cooperative and does not appear to be in acute distress. He is casually dressed. Patient states that he does not have a problem but he is here because his wife thinks he needs to get treatment for drinking alcohol, probably AA. He states that he does not have a drinking problem but has been drinking a little bit more than usual since his aunt passed away last week, no more than any other "black family would during a repast." He reports drinking on average, 1-3 beers daily. He states that he consumed 2 beers yesterday and a 1/2 beer today. He denies a history of alcohol withdrawal, seizures or DTs. He denies alcohol withdrawal symptoms at this time. He reports most recently the longest he's been without alcohol is for 2-3 months, 6 months ago. He denies past substance abuse treatments. He denies using illicit drugs but reports using marijuana and CBD daily. He reports feeling depressed for a while and states that he loss his mom last year, found her dead and also loss six other people. He describes his  depressive symptoms as sometimes feeling lethargic, habit changing, anhedonia, feeling numb or overly emotional. He denies past or present outpatient psychiatry or therapy. He denies past inpatient psychiatric hospitalizations. He does report taking Paxil last year for 2 days for anxiety but did not like the way the medication was affecting his body. He reports a family history of alcohol abuse. He resides with his wife and two daughters ages 18 and 34 years old. He has two sons ages 43 and 71 years old who visits. 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 Patient expressed that he is interested in outpatient treatment options. I discussed with the patient various treatment options which includes outpatient psychiatry, alcohol and drug services, facility based crisis for detox and [facility]  Patient educated on the risks of severe alcohol withdrawal symptoms which includes mental confusion, hallucinations, excessive vomiting or diarrhea. Patient advised to present to the nearest emergency department if he develops any of the stated symptoms. Patient verbalizes understanding. Patient agreeable to following up with the alcohol and drug services for outpatient substance abuse treatment and the [facility] behavioral health outpatient clinic for psychiatry to address depression. Safety planning completed. Patient safely discharged.    Flowsheet Row ED from 12/30/2022 in [facility] Office Visit from 06/21/2022 in [facility] Primary Care at [facility] ED from 01/04/2022 in [facility] Emergency Department at  Hospital  C-SSRS RISK CATEGORY No Risk No Risk No Risk       Psychiatric Specialty Exam  Presentation  General Appearance:Appropriate for Environment  Eye Contact:Fair  Speech:Clear and Coherent  Speech Volume:Normal  Handedness:Right   Mood and Affect  Mood: Euthymic  Affect: Congruent   Thought Process   Thought Processes: Coherent  Descriptions of Associations:Intact  Orientation:Full (Time, Place and Person)  Thought Content:Logical    Hallucinations:None  Ideas of Reference:None  Suicidal Thoughts:No  Homicidal Thoughts:No   Sensorium  Memory: Immediate Fair; Recent Fair; Remote Fair  Judgment: Fair  Insight: Fair   Art therapist  Concentration: Fair  Attention Span: Fair  Recall: [employer] of Knowledge: Fair  Language: Fair   Psychomotor Activity  Psychomotor Activity: Normal   Assets  Assets: Manufacturing systems engineer; Desire for Improvement; Financial Resources/Insurance; Housing; Intimacy; Leisure Time; Physical Health; Resilience; Transportation   Sleep  Sleep: Fair   Physical Exam: Physical Exam HENT:     Head: Normocephalic.     Nose: Nose normal.  Eyes:     Conjunctiva/sclera: Conjunctivae normal.  Cardiovascular:     Rate and Rhythm: Normal rate.  Pulmonary:     Effort: Pulmonary effort is normal.  Musculoskeletal:        General: Normal range of motion.     Cervical back: Normal range of motion.  Neurological:     Mental Status: He is alert.    Review of Systems  Constitutional: Negative.   HENT: Negative.    Eyes: Negative.   Respiratory: Negative.    Cardiovascular: Negative.   Gastrointestinal: Negative.   Genitourinary: Negative.   Musculoskeletal: Negative.   Neurological: Negative.   Endo/Heme/Allergies: Negative.   Psychiatric/Behavioral:  Positive for substance abuse.    Blood pressure (!) 130/92, pulse 100, temperature 98.1 F (36.7 C), temperature source Oral, resp. rate 16, SpO2 98 %. There is no height or weight on file to calculate BMI.  Musculoskeletal: Strength & Muscle Tone: within normal limits Gait & Station: unsteady Patient leans: N/A   [facility] MSE Discharge Disposition for Follow up and Recommendations: Based on my evaluation the patient does not appear to have an emergency medical  condition and can be discharged with resources and follow up care in outpatient services for Individual Therapy and Group Therapy  Discharge recommendations:   Substance abuse: Please review list for residential and outpatient substance abuse facilities for treatment. Review handout of alcohol withdrawal.   Get help right away if: You have fast or uneven heartbeats. You have chest pain. You have trouble breathing. You are told you had a seizure. You see, hear, feel, smell, or taste something that is not there. You get very confused.  Outpatient Follow up: Please review list of outpatient resources for psychiatry and counseling. Please follow up with your primary care provider for all medical related needs.   Therapy: We recommend that patient participate in individual therapy to address mental health concerns.  Safety:   The following safety precautions should be taken:   No sharp objects. This includes scissors, razors, scrapers, and putty knives.   Chemicals should be removed and locked up.   Medications should be removed and locked up.   Weapons should be removed and locked up. This includes firearms, knives and instruments that can be used to cause injury.   The patient should abstain from use of illicit substances/drugs and abuse of any medications.  If symptoms worsen or do not continue to improve or if the patient becomes actively suicidal or homicidal then it is recommended that the patient return to the closest hospital emergency department, the  [facility], or call 911 for further evaluation and treatment. National Suicide Prevention Lifeline 1-800-SUICIDE or 713-162-7372.  About 988 988 offers 24/7 access to trained crisis counselors who can help people experiencing mental health-related distress. People can call or text 988 or chat 988lifeline.org for themselves or if they are worried about a loved one who may need crisis support.      Ardena Gangl L, NP 12/30/2022, 12:06 PM

## 2022-12-30 NOTE — Progress Notes (Signed)
   12/30/22 1144  [facility] Triage Screening (Walk-ins at [facility] only)  How Did You Hear About [place]? Self  What Is the Reason for Your Visit/Call Today? Pt presents to [facility] voluntarily seeking a alcohol use treatment. Pt reports drinking 1-3 beers daily, last use was 1/2 a beer today. Pt reports his drinking has increased since his aunt passed away last week, and his wife is concerned. Pt reports marijuana use every other day, last use was today. Pt denies any past withdrawal symptoms. Pt states he is interested in outpatient therapy and A.A. Pt denies SI/HI and AVH at this time.  How Long Has This Been Causing You Problems? > than 6 months  Have You Recently Had Any Thoughts About Hurting Yourself? No  Are You Planning to Commit Suicide/Harm Yourself At This time? No  Have you Recently Had Thoughts About Hurting Someone Karolee Ohs? No  Are You Planning To Harm Someone At This Time? No  Are you currently experiencing any auditory, visual or other hallucinations? No  Have You Used Any Alcohol or Drugs in the Past 24 Hours? Yes  How long ago did you use Drugs or Alcohol? today  What Did You Use and How Much? 1/2 beer and marijuana  Do you have any current medical co-morbidities that require immediate attention? No  Clinician description of patient physical appearance/behavior: casually dressed t-shirt and shorts, alert, cooperative  What Do You Feel Would Help You the Most Today? Alcohol or Drug Use Treatment  If access to [facility] was not available, would you have sought care in the Emergency Department? No  Determination of Need Routine (7 days)  Options For Referral Outpatient Therapy;Medication Management;Other: Comment

## 2022-12-30 NOTE — Discharge Instructions (Addendum)
Discharge recommendations:   Substance abuse: Please review list for residential and outpatient substance abuse facilities for treatment. Review handout of alcohol withdrawal.   Get help right away if: You have fast or uneven heartbeats. You have chest pain. You have trouble breathing. You are told you had a seizure. You see, hear, feel, smell, or taste something that is not there. You get very confused.  Outpatient Follow up: Please review list of outpatient resources for psychiatry and counseling. Please follow up with your primary care provider for all medical related needs.   Therapy: We recommend that patient participate in individual therapy to address mental health concerns.  Safety:   The following safety precautions should be taken:   No sharp objects. This includes scissors, razors, scrapers, and putty knives.   Chemicals should be removed and locked up.   Medications should be removed and locked up.   Weapons should be removed and locked up. This includes firearms, knives and instruments that can be used to cause injury.   The patient should abstain from use of illicit substances/drugs and abuse of any medications.  If symptoms worsen or do not continue to improve or if the patient becomes actively suicidal or homicidal then it is recommended that the patient return to the closest hospital emergency department, the [facility], or call 911 for further evaluation and treatment. National Suicide Prevention Lifeline 1-800-SUICIDE or 5805226500.  About 988 988 offers 24/7 access to trained crisis counselors who can help people experiencing mental health-related distress. People can call or text 988 or chat 988lifeline.org for themselves or if they are worried about a loved one who may need crisis support.

## 2022-12-31 ENCOUNTER — Ambulatory Visit (INDEPENDENT_AMBULATORY_CARE_PROVIDER_SITE_OTHER): Payer: No Payment, Other | Admitting: Licensed Clinical Social Worker

## 2022-12-31 ENCOUNTER — Encounter (HOSPITAL_COMMUNITY): Payer: Self-pay | Admitting: Licensed Clinical Social Worker

## 2022-12-31 DIAGNOSIS — F1014 Alcohol abuse with alcohol-induced mood disorder: Secondary | ICD-10-CM | POA: Diagnosis not present

## 2022-12-31 DIAGNOSIS — F1290 Cannabis use, unspecified, uncomplicated: Secondary | ICD-10-CM
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
## 2023-01-30 ENCOUNTER — Other Ambulatory Visit (HOSPITAL_COMMUNITY): Payer: Self-pay

## 2023-02-05 ENCOUNTER — Emergency Department (HOSPITAL_BASED_OUTPATIENT_CLINIC_OR_DEPARTMENT_OTHER)
Admission: EM | Admit: 2023-02-05 | Discharge: 2023-02-05 | Disposition: A | Discharge status: Home / Self Care | Payer: Medicaid Other | Attending: Emergency Medicine | Admitting: Emergency Medicine

## 2023-02-05 ENCOUNTER — Emergency Department (HOSPITAL_BASED_OUTPATIENT_CLINIC_OR_DEPARTMENT_OTHER): Payer: Medicaid Other

## 2023-02-05 ENCOUNTER — Encounter (HOSPITAL_BASED_OUTPATIENT_CLINIC_OR_DEPARTMENT_OTHER): Payer: Self-pay

## 2023-02-05 ENCOUNTER — Other Ambulatory Visit: Payer: Self-pay

## 2023-02-05 DIAGNOSIS — M79671 Pain in right foot: Secondary | ICD-10-CM

## 2023-02-05 DIAGNOSIS — M21371 Foot drop, right foot: Secondary | ICD-10-CM | POA: Insufficient documentation

## 2023-02-05 LAB — CBG MONITORING, ED: Glucose-Capillary: 117 mg/dL — ABNORMAL HIGH (ref 70–99)

## 2023-02-05 MED ORDER — HYDROCODONE-ACETAMINOPHEN 5-325 MG PO TABS: 1.0000 | ORAL_TABLET | Freq: Four times a day (QID) | ORAL | 0 refills | Status: DC | PRN

## 2023-02-05 MED ORDER — MORPHINE SULFATE (PF) 4 MG/ML IV SOLN
4.0000 mg | Freq: Once | INTRAVENOUS | Status: AC
  Administered 2023-02-05: 4 mg via INTRAMUSCULAR
  Filled 2023-02-05: qty 1

## 2023-02-05 NOTE — ED Triage Notes (Signed)
Patient here POV from Home.  Endorses Right Foot Pain radiating to Right Ankle that began 2 Days ago. No Discernable Trauma.   NAD Noted during Triage. A&Ox4. GCS 15. BIB Wheelchair.

## 2023-02-05 NOTE — Discharge Instructions (Signed)
Your x-rays did not show any fracture  I recommend that you see podiatry to get custom orthotics for your foot drop  Return to ER if you have worse foot pain or unable to walk
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
## 2023-02-05 NOTE — ED Notes (Signed)
Reviewed AVS with patient, patient expressed understanding of directions, denies further questions at this time. 

## 2023-03-13 ENCOUNTER — Other Ambulatory Visit: Payer: Self-pay

## 2023-03-13 ENCOUNTER — Encounter (HOSPITAL_BASED_OUTPATIENT_CLINIC_OR_DEPARTMENT_OTHER): Payer: Self-pay

## 2023-03-13 ENCOUNTER — Emergency Department (HOSPITAL_BASED_OUTPATIENT_CLINIC_OR_DEPARTMENT_OTHER)
Admission: EM | Admit: 2023-03-13 | Discharge: 2023-03-14 | Disposition: A | Discharge status: Home / Self Care | Payer: Medicaid Other | Attending: Emergency Medicine | Admitting: Emergency Medicine

## 2023-03-13 DIAGNOSIS — Z7951 Long term (current) use of inhaled steroids: Secondary | ICD-10-CM | POA: Insufficient documentation

## 2023-03-13 DIAGNOSIS — M79671 Pain in right foot: Secondary | ICD-10-CM | POA: Insufficient documentation

## 2023-03-13 DIAGNOSIS — J45909 Unspecified asthma, uncomplicated: Secondary | ICD-10-CM | POA: Insufficient documentation
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
## 2023-03-14 MED ORDER — DEXAMETHASONE 4 MG PO TABS
6.0000 mg | ORAL_TABLET | Freq: Once | ORAL | Status: AC
  Administered 2023-03-14: 6 mg via ORAL
  Filled 2023-03-14: qty 2

## 2023-03-14 MED ORDER — KETOROLAC TROMETHAMINE 60 MG/2ML IM SOLN
30.0000 mg | Freq: Once | INTRAMUSCULAR | Status: AC
  Administered 2023-03-14: 30 mg via INTRAMUSCULAR
  Filled 2023-03-14: qty 2

## 2023-03-14 MED ORDER — DICLOFENAC SODIUM 1 % EX GEL: 4.0000 g | Freq: Four times a day (QID) | CUTANEOUS | 0 refills | Status: AC
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
## 2023-07-22 ENCOUNTER — Emergency Department: Payer: Self-pay

## 2023-07-22 ENCOUNTER — Encounter: Payer: Self-pay | Admitting: Medical Oncology

## 2023-07-22 ENCOUNTER — Observation Stay: Payer: Self-pay

## 2023-07-22 ENCOUNTER — Inpatient Hospital Stay
Admission: EM | Admit: 2023-07-22 | Discharge: 2023-07-25 | DRG: 372 | Disposition: A | Discharge status: Home / Self Care | Payer: Self-pay | Attending: Internal Medicine | Admitting: Internal Medicine

## 2023-07-22 ENCOUNTER — Other Ambulatory Visit: Payer: Self-pay

## 2023-07-22 DIAGNOSIS — E872 Acidosis, unspecified: Secondary | ICD-10-CM | POA: Diagnosis present

## 2023-07-22 DIAGNOSIS — Z833 Family history of diabetes mellitus: Secondary | ICD-10-CM

## 2023-07-22 DIAGNOSIS — Z886 Allergy status to analgesic agent status: Secondary | ICD-10-CM

## 2023-07-22 DIAGNOSIS — A045 Campylobacter enteritis: Principal | ICD-10-CM | POA: Diagnosis present

## 2023-07-22 DIAGNOSIS — Z825 Family history of asthma and other chronic lower respiratory diseases: Secondary | ICD-10-CM

## 2023-07-22 DIAGNOSIS — E876 Hypokalemia: Secondary | ICD-10-CM | POA: Diagnosis present

## 2023-07-22 DIAGNOSIS — R197 Diarrhea, unspecified: Secondary | ICD-10-CM

## 2023-07-22 DIAGNOSIS — M545 Low back pain, unspecified: Secondary | ICD-10-CM | POA: Diagnosis present

## 2023-07-22 DIAGNOSIS — K529 Noninfective gastroenteritis and colitis, unspecified: Principal | ICD-10-CM

## 2023-07-22 DIAGNOSIS — R933 Abnormal findings on diagnostic imaging of other parts of digestive tract: Secondary | ICD-10-CM | POA: Diagnosis not present

## 2023-07-22 DIAGNOSIS — Z8249 Family history of ischemic heart disease and other diseases of the circulatory system: Secondary | ICD-10-CM

## 2023-07-22 DIAGNOSIS — I10 Essential (primary) hypertension: Secondary | ICD-10-CM | POA: Diagnosis present

## 2023-07-22 DIAGNOSIS — J4520 Mild intermittent asthma, uncomplicated: Secondary | ICD-10-CM | POA: Diagnosis present

## 2023-07-22 DIAGNOSIS — G8929 Other chronic pain: Secondary | ICD-10-CM | POA: Diagnosis present

## 2023-07-22 DIAGNOSIS — Z9049 Acquired absence of other specified parts of digestive tract: Secondary | ICD-10-CM

## 2023-07-22 DIAGNOSIS — E785 Hyperlipidemia, unspecified: Secondary | ICD-10-CM | POA: Diagnosis present

## 2023-07-22 DIAGNOSIS — K6389 Other specified diseases of intestine: Secondary | ICD-10-CM | POA: Diagnosis present

## 2023-07-22 DIAGNOSIS — I2510 Atherosclerotic heart disease of native coronary artery without angina pectoris: Secondary | ICD-10-CM | POA: Diagnosis present

## 2023-07-22 DIAGNOSIS — Z23 Encounter for immunization: Secondary | ICD-10-CM

## 2023-07-22 DIAGNOSIS — Z79899 Other long term (current) drug therapy: Secondary | ICD-10-CM

## 2023-07-22 DIAGNOSIS — F17210 Nicotine dependence, cigarettes, uncomplicated: Secondary | ICD-10-CM | POA: Diagnosis present

## 2023-07-22 LAB — COMPREHENSIVE METABOLIC PANEL
ALT: 39 U/L (ref 0–44)
AST: 27 U/L (ref 15–41)
Albumin: 4.1 g/dL (ref 3.5–5.0)
Alkaline Phosphatase: 45 U/L (ref 38–126)
Anion gap: 16 — ABNORMAL HIGH (ref 5–15)
BUN: 9 mg/dL (ref 6–20)
CO2: 17 mmol/L — ABNORMAL LOW (ref 22–32)
Calcium: 8.8 mg/dL — ABNORMAL LOW (ref 8.9–10.3)
Chloride: 101 mmol/L (ref 98–111)
Creatinine, Ser: 1.02 mg/dL (ref 0.61–1.24)
GFR, Estimated: >60 mL/min (ref >60)
Glucose, Bld: 162 mg/dL — ABNORMAL HIGH (ref 70–99)
Potassium: 2.6 mmol/L — CL (ref 3.5–5.1)
Sodium: 134 mmol/L — ABNORMAL LOW (ref 135–145)
Total Bilirubin: 0.7 mg/dL (ref <1.2)
Total Protein: 7.9 g/dL (ref 6.5–8.1)

## 2023-07-22 LAB — BASIC METABOLIC PANEL
Anion gap: 11 (ref 5–15)
BUN: 11 mg/dL (ref 6–20)
CO2: 21 mmol/L — ABNORMAL LOW (ref 22–32)
Calcium: 8.5 mg/dL — ABNORMAL LOW (ref 8.9–10.3)
Chloride: 103 mmol/L (ref 98–111)
Creatinine, Ser: 1.09 mg/dL (ref 0.61–1.24)
GFR, Estimated: >60 mL/min (ref >60)
Glucose, Bld: 161 mg/dL — ABNORMAL HIGH (ref 70–99)
Potassium: 3.6 mmol/L (ref 3.5–5.1)
Sodium: 135 mmol/L (ref 135–145)

## 2023-07-22 LAB — GASTROINTESTINAL PANEL BY PCR, STOOL (REPLACES STOOL CULTURE)

## 2023-07-22 LAB — URINALYSIS, ROUTINE W REFLEX MICROSCOPIC
Bilirubin Urine: NEGATIVE
Glucose, UA: NEGATIVE mg/dL
Hgb urine dipstick: NEGATIVE
Ketones, ur: NEGATIVE mg/dL
Leukocytes,Ua: NEGATIVE
Nitrite: NEGATIVE
Protein, ur: NEGATIVE mg/dL
Specific Gravity, Urine: >1.046 — ABNORMAL HIGH (ref 1.005–1.030)
pH: 6 (ref 5.0–8.0)

## 2023-07-22 LAB — C-REACTIVE PROTEIN: CRP: 9.2 mg/dL — ABNORMAL HIGH (ref <1.0)

## 2023-07-22 LAB — CBC
HCT: 50.6 % (ref 39.0–52.0)
Hemoglobin: 17.9 g/dL — ABNORMAL HIGH (ref 13.0–17.0)
MCH: 33.2 pg (ref 26.0–34.0)
MCHC: 35.4 g/dL (ref 30.0–36.0)
MCV: 93.9 fL (ref 80.0–100.0)
Platelets: 258 10*3/uL (ref 150–400)
RBC: 5.39 MIL/uL (ref 4.22–5.81)
RDW: 13.3 % (ref 11.5–15.5)
WBC: 6.2 10*3/uL (ref 4.0–10.5)
nRBC: 0 % (ref 0.0–0.2)

## 2023-07-22 LAB — PHOSPHORUS: Phosphorus: 2.4 mg/dL — ABNORMAL LOW (ref 2.5–4.6)

## 2023-07-22 LAB — LIPASE, BLOOD: Lipase: 21 U/L (ref 11–51)

## 2023-07-22 LAB — LACTIC ACID, PLASMA: Lactic Acid, Venous: 1.9 mmol/L (ref 0.5–1.9)

## 2023-07-22 LAB — SEDIMENTATION RATE: Sed Rate: 26 mm/h — ABNORMAL HIGH (ref 0–15)

## 2023-07-22 LAB — MAGNESIUM: Magnesium: 1.9 mg/dL (ref 1.7–2.4)

## 2023-07-22 MED ORDER — ACETAMINOPHEN 500 MG PO TABS: 1000.0000 mg | ORAL_TABLET | Freq: Four times a day (QID) | ORAL | Status: DC | PRN

## 2023-07-22 MED ORDER — METRONIDAZOLE 500 MG/100ML IV SOLN
500.0000 mg | Freq: Three times a day (TID) | INTRAVENOUS | Status: DC
  Administered 2023-07-22 – 2023-07-23 (×3): 500 mg via INTRAVENOUS
  Filled 2023-07-22 (×4): qty 100

## 2023-07-22 MED ORDER — MORPHINE SULFATE (PF) 4 MG/ML IV SOLN
4.0000 mg | INTRAVENOUS | Status: DC | PRN
  Administered 2023-07-22 – 2023-07-25 (×14): 4 mg via INTRAVENOUS
  Filled 2023-07-22 (×14): qty 1

## 2023-07-22 MED ORDER — ONDANSETRON HCL 4 MG/2ML IJ SOLN
4.0000 mg | Freq: Four times a day (QID) | INTRAMUSCULAR | Status: DC | PRN
  Administered 2023-07-22: 4 mg via INTRAVENOUS
  Filled 2023-07-22: qty 2

## 2023-07-22 MED ORDER — AZITHROMYCIN 500 MG PO TABS
500.0000 mg | ORAL_TABLET | Freq: Every day | ORAL | Status: AC
  Administered 2023-07-22 – 2023-07-24 (×3): 500 mg via ORAL
  Filled 2023-07-22 (×3): qty 1

## 2023-07-22 MED ORDER — LACTATED RINGERS IV BOLUS
1000.0000 mL | Freq: Once | INTRAVENOUS | Status: AC
  Administered 2023-07-22: 1000 mL via INTRAVENOUS

## 2023-07-22 MED ORDER — ONDANSETRON HCL 4 MG PO TABS: 4.0000 mg | ORAL_TABLET | Freq: Four times a day (QID) | ORAL | Status: DC | PRN

## 2023-07-22 MED ORDER — HYDROCODONE-ACETAMINOPHEN 5-325 MG PO TABS
1.0000 | ORAL_TABLET | Freq: Four times a day (QID) | ORAL | Status: DC | PRN
  Administered 2023-07-22 – 2023-07-24 (×6): 1 via ORAL
  Filled 2023-07-22 (×6): qty 1

## 2023-07-22 MED ORDER — PEG 3350-KCL-NA BICARB-NACL 420 G PO SOLR
4000.0000 mL | Freq: Once | ORAL | Status: AC
  Administered 2023-07-22: 4000 mL via ORAL
  Filled 2023-07-22: qty 4000

## 2023-07-22 MED ORDER — IPRATROPIUM-ALBUTEROL 0.5-2.5 (3) MG/3ML IN SOLN
3.0000 mL | Freq: Once | RESPIRATORY_TRACT | Status: AC
  Administered 2023-07-22: 3 mL via RESPIRATORY_TRACT
  Filled 2023-07-22: qty 3

## 2023-07-22 MED ORDER — ALBUTEROL SULFATE (2.5 MG/3ML) 0.083% IN NEBU
3.0000 mL | INHALATION_SOLUTION | Freq: Four times a day (QID) | RESPIRATORY_TRACT | Status: DC | PRN
  Administered 2023-07-22 – 2023-07-23 (×2): 3 mL via RESPIRATORY_TRACT
  Filled 2023-07-22 (×2): qty 3

## 2023-07-22 MED ORDER — PAROXETINE HCL 20 MG PO TABS: 20.0000 mg | ORAL_TABLET | Freq: Every day | ORAL | Status: DC

## 2023-07-22 MED ORDER — METRONIDAZOLE 500 MG/100ML IV SOLN: 500.0000 mg | Freq: Two times a day (BID) | INTRAVENOUS | Status: DC

## 2023-07-22 MED ORDER — KETOROLAC TROMETHAMINE 15 MG/ML IJ SOLN
15.0000 mg | Freq: Once | INTRAMUSCULAR | Status: AC
  Administered 2023-07-22: 15 mg via INTRAVENOUS
  Filled 2023-07-22: qty 1

## 2023-07-22 MED ORDER — METHYLPREDNISOLONE SODIUM SUCC 40 MG IJ SOLR
40.0000 mg | Freq: Once | INTRAMUSCULAR | Status: AC
  Administered 2023-07-22: 40 mg via INTRAVENOUS
  Filled 2023-07-22: qty 1

## 2023-07-22 MED ORDER — CIPROFLOXACIN IN D5W 400 MG/200ML IV SOLN
400.0000 mg | Freq: Two times a day (BID) | INTRAVENOUS | Status: DC
Start: 2023-07-22 — End: 2023-07-23
  Administered 2023-07-22 – 2023-07-23 (×2): 400 mg via INTRAVENOUS
  Filled 2023-07-22 (×3): qty 200

## 2023-07-22 MED ORDER — POTASSIUM & SODIUM PHOSPHATES 280-160-250 MG PO PACK
1.0000 | PACK | Freq: Once | ORAL | Status: AC
  Administered 2023-07-22: 1 via ORAL
  Filled 2023-07-22: qty 1

## 2023-07-22 MED ORDER — INFLUENZA VIRUS VACC SPLIT PF (FLUZONE) 0.5 ML IM SUSY
0.5000 mL | PREFILLED_SYRINGE | INTRAMUSCULAR | Status: DC
  Filled 2023-07-22: qty 0.5

## 2023-07-22 MED ORDER — MORPHINE SULFATE (PF) 4 MG/ML IV SOLN
4.0000 mg | Freq: Once | INTRAVENOUS | Status: AC
  Administered 2023-07-22: 4 mg via INTRAVENOUS
  Filled 2023-07-22: qty 1

## 2023-07-22 MED ORDER — ONDANSETRON HCL 4 MG/2ML IJ SOLN
4.0000 mg | Freq: Once | INTRAMUSCULAR | Status: AC
  Administered 2023-07-22: 4 mg via INTRAVENOUS
  Filled 2023-07-22: qty 2

## 2023-07-22 MED ORDER — IOHEXOL 300 MG/ML  SOLN
100.0000 mL | Freq: Once | INTRAMUSCULAR | Status: AC | PRN
  Administered 2023-07-22: 100 mL via INTRAVENOUS

## 2023-07-22 MED ORDER — MORPHINE SULFATE (PF) 4 MG/ML IV SOLN: 4.0000 mg | Freq: Once | INTRAVENOUS | Status: DC

## 2023-07-22 MED ORDER — POTASSIUM CHLORIDE 10 MEQ/100ML IV SOLN
10.0000 meq | Freq: Once | INTRAVENOUS | Status: AC
  Administered 2023-07-22: 10 meq via INTRAVENOUS
  Filled 2023-07-22: qty 100

## 2023-07-22 MED ORDER — POTASSIUM CHLORIDE CRYS ER 20 MEQ PO TBCR
40.0000 meq | EXTENDED_RELEASE_TABLET | ORAL | Status: AC
  Administered 2023-07-22 (×3): 40 meq via ORAL
  Filled 2023-07-22 (×3): qty 2

## 2023-07-22 NOTE — ED Triage Notes (Signed)
Pt reports abd pain all over since yesterday with NVD. Unk if he's had fever.

## 2023-07-22 NOTE — ED Notes (Signed)
Admission Md at bedside ?
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
## 2023-07-22 NOTE — ED Notes (Signed)
Pt reporting to this RN the pain has made his asthma feel worse and is requesting a breathing tx. EDP made aware.
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
## 2023-07-22 NOTE — ED Notes (Signed)
Pt unhooked to use restroom. Pt with steady gait
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
## 2023-07-22 NOTE — Progress Notes (Signed)
Campylobacter positive, will start Azithromycin x3 days.

## 2023-07-23 ENCOUNTER — Encounter
Admission: EM | Disposition: A | Discharge status: Home / Self Care | Payer: Self-pay | Source: Home / Self Care | Attending: Internal Medicine

## 2023-07-23 ENCOUNTER — Encounter: Payer: Self-pay | Admitting: Internal Medicine

## 2023-07-23 ENCOUNTER — Observation Stay: Payer: Self-pay | Admitting: Registered Nurse

## 2023-07-23 DIAGNOSIS — A045 Campylobacter enteritis: Secondary | ICD-10-CM | POA: Diagnosis present

## 2023-07-23 DIAGNOSIS — Z825 Family history of asthma and other chronic lower respiratory diseases: Secondary | ICD-10-CM | POA: Diagnosis not present

## 2023-07-23 DIAGNOSIS — Z886 Allergy status to analgesic agent status: Secondary | ICD-10-CM | POA: Diagnosis not present

## 2023-07-23 DIAGNOSIS — Z8249 Family history of ischemic heart disease and other diseases of the circulatory system: Secondary | ICD-10-CM | POA: Diagnosis not present

## 2023-07-23 DIAGNOSIS — M545 Low back pain, unspecified: Secondary | ICD-10-CM | POA: Diagnosis present

## 2023-07-23 DIAGNOSIS — K6389 Other specified diseases of intestine: Secondary | ICD-10-CM | POA: Diagnosis present

## 2023-07-23 DIAGNOSIS — E872 Acidosis, unspecified: Secondary | ICD-10-CM | POA: Diagnosis present

## 2023-07-23 DIAGNOSIS — I2510 Atherosclerotic heart disease of native coronary artery without angina pectoris: Secondary | ICD-10-CM | POA: Diagnosis present

## 2023-07-23 DIAGNOSIS — I10 Essential (primary) hypertension: Secondary | ICD-10-CM | POA: Diagnosis present

## 2023-07-23 DIAGNOSIS — Z79899 Other long term (current) drug therapy: Secondary | ICD-10-CM | POA: Diagnosis not present

## 2023-07-23 DIAGNOSIS — E785 Hyperlipidemia, unspecified: Secondary | ICD-10-CM | POA: Diagnosis present

## 2023-07-23 DIAGNOSIS — K529 Noninfective gastroenteritis and colitis, unspecified: Secondary | ICD-10-CM | POA: Diagnosis present

## 2023-07-23 DIAGNOSIS — J4520 Mild intermittent asthma, uncomplicated: Secondary | ICD-10-CM | POA: Diagnosis present

## 2023-07-23 DIAGNOSIS — Z833 Family history of diabetes mellitus: Secondary | ICD-10-CM | POA: Diagnosis not present

## 2023-07-23 DIAGNOSIS — E876 Hypokalemia: Secondary | ICD-10-CM | POA: Diagnosis present

## 2023-07-23 DIAGNOSIS — F17210 Nicotine dependence, cigarettes, uncomplicated: Secondary | ICD-10-CM | POA: Diagnosis present

## 2023-07-23 DIAGNOSIS — Z23 Encounter for immunization: Secondary | ICD-10-CM | POA: Diagnosis not present

## 2023-07-23 DIAGNOSIS — Z9049 Acquired absence of other specified parts of digestive tract: Secondary | ICD-10-CM | POA: Diagnosis not present

## 2023-07-23 DIAGNOSIS — G8929 Other chronic pain: Secondary | ICD-10-CM | POA: Diagnosis present

## 2023-07-23 HISTORY — PX: COLONOSCOPY WITH PROPOFOL: SHX5780

## 2023-07-23 LAB — BASIC METABOLIC PANEL
Anion gap: 9 (ref 5–15)
BUN: 7 mg/dL (ref 6–20)
CO2: 21 mmol/L — ABNORMAL LOW (ref 22–32)
Calcium: 8.4 mg/dL — ABNORMAL LOW (ref 8.9–10.3)
Chloride: 106 mmol/L (ref 98–111)
Creatinine, Ser: 0.98 mg/dL (ref 0.61–1.24)
GFR, Estimated: >60 mL/min (ref >60)
Glucose, Bld: 107 mg/dL — ABNORMAL HIGH (ref 70–99)
Potassium: 3.9 mmol/L (ref 3.5–5.1)
Sodium: 136 mmol/L (ref 135–145)

## 2023-07-23 LAB — HIV ANTIBODY (ROUTINE TESTING W REFLEX): HIV Screen 4th Generation wRfx: NONREACTIVE

## 2023-07-23 SURGERY — COLONOSCOPY WITH PROPOFOL
Anesthesia: General

## 2023-07-23 MED ORDER — INFLUENZA VIRUS VACC SPLIT PF (FLUZONE) 0.5 ML IM SUSY
0.5000 mL | PREFILLED_SYRINGE | INTRAMUSCULAR | Status: AC
  Administered 2023-07-23: 0.5 mL via INTRAMUSCULAR
  Filled 2023-07-23: qty 0.5

## 2023-07-23 MED ORDER — DEXMEDETOMIDINE HCL IN NACL 80 MCG/20ML IV SOLN
INTRAVENOUS | Status: DC | PRN
  Administered 2023-07-23: 8 ug via INTRAVENOUS

## 2023-07-23 MED ORDER — LIDOCAINE HCL (PF) 2 % IJ SOLN
INTRAMUSCULAR | Status: AC
  Filled 2023-07-23: qty 5

## 2023-07-23 MED ORDER — PROPOFOL 500 MG/50ML IV EMUL
INTRAVENOUS | Status: DC | PRN
  Administered 2023-07-23: 200 ug/kg/min via INTRAVENOUS

## 2023-07-23 MED ORDER — METOPROLOL TARTRATE 5 MG/5ML IV SOLN: 5.0000 mg | INTRAVENOUS | Status: DC | PRN

## 2023-07-23 MED ORDER — PROPOFOL 10 MG/ML IV BOLUS
INTRAVENOUS | Status: DC | PRN
  Administered 2023-07-23: 50 mg via INTRAVENOUS

## 2023-07-23 MED ORDER — HYDRALAZINE HCL 20 MG/ML IJ SOLN: 10.0000 mg | INTRAMUSCULAR | Status: DC | PRN

## 2023-07-23 MED ORDER — PROPOFOL 10 MG/ML IV BOLUS
INTRAVENOUS | Status: AC
  Filled 2023-07-23: qty 40

## 2023-07-23 MED ORDER — SENNOSIDES-DOCUSATE SODIUM 8.6-50 MG PO TABS: 1.0000 | ORAL_TABLET | Freq: Every evening | ORAL | Status: DC | PRN

## 2023-07-23 MED ORDER — LIDOCAINE HCL (CARDIAC) PF 100 MG/5ML IV SOSY
PREFILLED_SYRINGE | INTRAVENOUS | Status: DC | PRN
  Administered 2023-07-23: 40 mg via INTRAVENOUS

## 2023-07-23 MED ORDER — SODIUM CHLORIDE 0.9 % IV SOLN: INTRAVENOUS | Status: DC | PRN

## 2023-07-23 NOTE — Hospital Course (Addendum)
Brief Narrative:  42 year old with history of HTN, asthma comes to the hospital abdominal pain, nausea and vomiting.  CT showed concerns of Crohn's flare versus gastroenteritis therefore admitted.  GI consulted.  Eventually stool studies resulted positive for Campylobacter therefore azithromycin for 3 days was ordered.   Assessment & Plan:  Principal Problem:   Hypokalemia Active Problems:   Colitis   Inflammatory bowel disease   Abdominal pain nausea vomiting Campylobacter acute gastroenteritis -Continue supportive care currently. Azithromycin 500 mg for 3 days.  Discontinue Cipro.  Supportive care. -GI team has been consulted.  Possibly will plan for colonoscopy eventually once recovered from this acute gastroenteritis symptoms.    Severe hypokalemia -Resolved.  History of asthma -Bronchodilators  Essential hypertension -Slowly resume home medication.  IV as needed  DVT prophylaxis: SCDs Start: 07/22/23 1111 Code Status: Full code Family Communication:  Brother at bedside Ongoing management for acute Campylobacter gastroenteritis Hopefully dc tomorrow if GI symptoms are better. .    Subjective: Seen at bedside after EGD.  Doing ok. Still has some cramping but better.    Examination:  General exam: Appears calm and comfortable  Respiratory system: Clear to auscultation. Respiratory effort normal. Cardiovascular system: S1 & S2 heard, RRR. No JVD, murmurs, rubs, gallops or clicks. No pedal edema. Gastrointestinal system: Abdomen is nondistended, soft and nontender. No organomegaly or masses felt. Normal bowel sounds heard. Central nervous system: Alert and oriented. No focal neurological deficits. Extremities: Symmetric 5 x 5 power. Skin: No rashes, lesions or ulcers Psychiatry: Judgement and insight appear normal. Mood & affect appropriate.

## 2023-07-23 NOTE — TOC CM/SW Note (Signed)
Transition of Care [facility]) - Inpatient Brief Assessment   Patient Details  Name: William Kane MRN: [id number] Date of Birth: 01-22-1981  [facility] CM/SW Contact:    Margarito Liner, LCSW Phone Number: 07/23/2023, 11:14 AM   Clinical Narrative: CSW reviewed chart. Patient does not have insurance. Pharmacist is aware. SDOH flag for transportation. Resources added to AVS. No other TOC needs identified at this time. Please place [facility] consult if any other needs arise.  Transition of Care Asessment: Insurance and Status: Selfpay Patient has primary care physician: Yes Home environment has been reviewed: Single family home Prior level of function:: Not documented Prior/Current Home Services: No current home services Social Determinants of Health Reivew: SDOH reviewed interventions complete Readmission risk has been reviewed: Yes Transition of care needs: no transition of care needs at this time

## 2023-07-23 NOTE — Op Note (Signed)
[facility] Gastroenterology Patient Name: William Kane Procedure Date: 07/23/2023 12:44 PM MRN: [id number] Account #: [id number] Date of Birth: 03-24-1981 Admit Type: Inpatient Age: 42 Room: [facility] ENDO ROOM 4 Gender: Male Note Status: Finalized Instrument Name: Prentice Docker [id number] Procedure:             Colonoscopy Indications:           Abnormal CT of the GI tract Providers:             [name] MD, MD Medicines:             Propofol per Anesthesia Complications:         No immediate complications. Procedure:             Pre-Anesthesia Assessment:                        - Prior to the procedure, a History and Physical was                         performed, and patient medications and allergies were                         reviewed. The patient's tolerance of previous                         anesthesia was also reviewed. The risks and benefits                         of the procedure and the sedation options and risks                         were discussed with the patient. All questions were                         answered, and informed consent was obtained. Prior                         Anticoagulants: The patient has taken no anticoagulant                         or antiplatelet agents. ASA Grade Assessment: II - A                         patient with mild systemic disease. After reviewing                         the risks and benefits, the patient was deemed in                         satisfactory condition to undergo the procedure.                        After obtaining informed consent, the colonoscope was                         passed under direct vision. Throughout the procedure,                         the patient's  blood pressure, pulse, and oxygen                         saturations were monitored continuously. The                         Colonoscope was introduced through the anus and                         advanced to the the terminal ileum. The  colonoscopy                         was performed without difficulty. The patient                         tolerated the procedure well. The quality of the bowel                         preparation was adequate to identify polyps. Findings:      The perianal and digital rectal examinations were normal.      A patchy area of moderately erythematous mucosa was found in the entire       colon. This was biopsied with a cold forceps for histology.      The terminal ileum appeared normal. Biopsies were taken with a cold       forceps for histology. Impression:            - Erythematous mucosa in the entire examined colon.                         Biopsied.                        - The examined portion of the ileum was normal.                         Biopsied. Recommendation:        - Discharge patient to home.                        - Resume previous diet.                        - Await pathology results. Procedure Code(s):     --- Professional ---                        [phone], Colonoscopy, flexible; with biopsy, single or                         multiple Diagnosis Code(s):     --- Professional ---                        R93.3, Abnormal findings on diagnostic imaging of                         other parts of digestive tract                        K63.89, Other specified diseases of intestine CPT copyright 2022 American Medical Association. All  rights reserved. The codes documented in this report are preliminary and upon coder review may  be revised to meet current compliance requirements. [name] MD, MD 07/23/2023 1:22:41 PM This report has been signed electronically. Number of Addenda: 0 Note Initiated On: 07/23/2023 12:44 PM Scope Withdrawal Time: 0 hours 6 minutes 17 seconds  Total Procedure Duration: 0 hours 9 minutes 18 seconds  Estimated Blood Loss:  Estimated blood loss: none.      [facility]

## 2023-07-23 NOTE — Plan of Care (Signed)
  Problem: Clinical Measurements: Goal: Ability to maintain clinical measurements within normal limits will improve Outcome: Progressing Goal: Respiratory complications will improve Outcome: Progressing Goal: Cardiovascular complication will be avoided Outcome: Progressing   Problem: Activity: Goal: Risk for activity intolerance will decrease Outcome: Progressing   Problem: Elimination: Goal: Will not experience complications related to urinary retention Outcome: Progressing   Problem: Skin Integrity: Goal: Risk for impaired skin integrity will decrease Outcome: Progressing   Problem: Clinical Measurements: Goal: Will remain free from infection Outcome: Not Progressing
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
## 2023-07-23 NOTE — Transfer of Care (Signed)
Immediate Anesthesia Transfer of Care Note  Patient: William Kane  Procedure(s) Performed: COLONOSCOPY WITH PROPOFOL  Patient Location: PACU  Anesthesia Type:General  Level of Consciousness: awake, alert , and oriented  Airway & Oxygen Therapy: Patient Spontanous Breathing  Post-op Assessment: Report given to RN and Post -op Vital signs reviewed and stable  Post vital signs: stable  Last Vitals:  Vitals Value Taken Time  BP 116/84 07/23/23 1324  Temp 35.8 C 07/23/23 1322  Pulse 97 07/23/23 1325  Resp    SpO2 100 % 07/23/23 1325  Vitals shown include unfiled device data.  Last Pain:  Vitals:   07/23/23 1322  TempSrc: Temporal  PainSc: 0-No pain      Patients Stated Pain Goal: 2 (07/22/23 2032)  Complications: No notable events documented.

## 2023-07-23 NOTE — Discharge Instructions (Signed)
Transportation Resources  Agency Name: [facility] Agency Address: [address] [place], [place] [address] Phone: [phone] Email: [email or web address] Website: [email or web address] Service(s) Offered: Housing services, self-sufficiency, congregate meal program, weatherization program, Field seismologist program, emergency food assistance,  housing counseling, home ownership program, wheels-towork program.  Agency Name: [facility] [employer] [phone]) Address: [address] [place], [place] [address] Phone: [phone] Website: [email or web address] Service(s) Offered: Transportation for [employer], subscription and demand response; Dial-a-Ride for citizens 10 years of age or older.  Agency Name: [facility] Address: [address] [place], [place] [address] Phone: [phone] Service(s) Offered: Child support services; child welfare services; food stamps; Medicaid; work first family assistance; and aid with fuel,  rent, food and medicine, transportation assistance.  Agency Name: [employer] (DAV) Transportation  Network Phone: [phone] Service(s) Offered: Transports veterans to the [facility] medical center. Call  forty-eight hours in advance and leave the name, telephone  number, date, and time of appointment. Veteran will be  contacted by the driver the day before the appointment to  arrange a pick up point   Transportation Resources  Agency Name: [facility] Agency Address: [address] [place], [place] [address] Phone: [phone] Email: [email or web address] Website: [email or web address] Service(s) Offered: Housing services, self-sufficiency, congregate meal program, weatherization program, Field seismologist program, emergency food assistance,  housing counseling, home ownership program, wheels-towork  program.  Agency Name: [facility] [employer] [phone]) Address: [address], [place], [place] [address] Phone: [phone] Website: [email or web address] Service(s) Offered: Transportation for [employer], subscription and demand response; Dial-a-Ride for citizens 40 years of age or older.  Agency Name: Department of Social Services Address: [address] [place], [place] [address] Phone: [phone] Service(s) Offered: Child support services; child welfare services; food stamps; Medicaid; work first family assistance; and aid with fuel,  rent, food and medicine, transportation assistance.  Agency Name: [employer]abled [employer] (DAV) Transportation  Network Phone: [phone] Service(s) Offered: Transports veterans to the [facility] medical center. Call  forty-eight hours in advance and leave the name, telephone  number, date, and time of appointment. Veteran will be  contacted by the driver the day before the appointment to  arrange a pick up point    [employer] currently provides door to door services. ACTA connects with PART daily for services to [facility]. ACT[employer] also performs contract services to [employer] operates 27 vehicles, all but 3 mini-vans are equipped with lifts for special needs as well as the general public. [employer] drivers are each CDL certified and trained in First Aid and CPR. [employer] was established in 2002 by [employer]. An independent Industrial/product designer. ACT[employer] operates via Cytogeneticist with required Research scientist (physical sciences) from [place]. [employer] provides over 80,000 passenger trips each year, including [facility] and [employer] sites.  Call at least by 11 AM one business day prior to needing transportation  [employer].                      [place], [place] [address]     Office  Hours: Monday-Friday  8 AM - 5 PM

## 2023-07-23 NOTE — Progress Notes (Signed)
PHARMACY CONSULT NOTE - ELECTROLYTES  Pharmacy Consult for Electrolyte Monitoring and Replacement   Recent Labs: Height: 5\' 11"  (180.3 cm) Weight: 102 kg (224 lb 13.9 oz) IBW/kg (Calculated) : 75.3 Estimated Creatinine Clearance: 120.7 mL/min (by C-G formula based on SCr of 0.98 mg/dL). Potassium (mmol/L)  Date Value  07/23/2023 3.9   Magnesium (mg/dL)  Date Value  81/19/1478 1.9   Calcium (mg/dL)  Date Value  29/56/2130 8.4 (L)   Albumin (g/dL)  Date Value  86/57/8469 4.1   Phosphorus (mg/dL)  Date Value  62/95/2841 2.4 (L)   Sodium (mmol/L)  Date Value  07/23/2023 136   Corrected Ca: 8.4 mg/dL     albumin 4.1  Assessment  William Kane is a 42 y.o. male presenting with Campylobacter+ diarrhea, abd pain. PMH significant for  HTN, mild intermittent asthma,current  ETOH and marjiuana use. Pharmacy has been consulted to monitor and replace electrolytes.  Diet: NPO MIVF:  Pertinent medications:    Goal of Therapy: Electrolytes WNL  Plan:  No electrolyte replacement at this time Check BMP, Mg, Phos with AM labs  Thank you for allowing pharmacy to be a part of this patient's care.  Angelique Blonder, PharmD Clinical Pharmacist 07/23/2023 9:12 AM

## 2023-07-23 NOTE — Progress Notes (Signed)
   07/23/23 1200  Spiritual Encounters  Type of Visit Initial  Care provided to: Pt and family  Referral source Patient request  Reason for visit Advance directives  OnCall Visit No   Chaplain met with patient in Endoscopy and prayed prior to procedure. Discussed AD and left documentation at bedside, per patient preference.  Patient prefers to discuss with family before proceeding.  Chaplain services remain available as the need arises.

## 2023-07-24 DIAGNOSIS — E876 Hypokalemia: Secondary | ICD-10-CM

## 2023-07-24 LAB — CBC
HCT: 44.9 % (ref 39.0–52.0)
Hemoglobin: 15.9 g/dL (ref 13.0–17.0)
MCH: 33.5 pg (ref 26.0–34.0)
MCHC: 35.4 g/dL (ref 30.0–36.0)
MCV: 94.7 fL (ref 80.0–100.0)
Platelets: 260 K/uL (ref 150–400)
RBC: 4.74 MIL/uL (ref 4.22–5.81)
RDW: 13.3 % (ref 11.5–15.5)
WBC: 4.4 K/uL (ref 4.0–10.5)
nRBC: 0 % (ref 0.0–0.2)

## 2023-07-24 LAB — BASIC METABOLIC PANEL
Anion gap: 8 (ref 5–15)
BUN: 9 mg/dL (ref 6–20)
CO2: 22 mmol/L (ref 22–32)
Calcium: 8.6 mg/dL — ABNORMAL LOW (ref 8.9–10.3)
Chloride: 107 mmol/L (ref 98–111)
Creatinine, Ser: 1.05 mg/dL (ref 0.61–1.24)
GFR, Estimated: >60 mL/min (ref >60)
Glucose, Bld: 95 mg/dL (ref 70–99)
Potassium: 3.7 mmol/L (ref 3.5–5.1)
Sodium: 137 mmol/L (ref 135–145)

## 2023-07-24 LAB — CALPROTECTIN, FECAL: Calprotectin, Fecal: 408 ug/g — ABNORMAL HIGH (ref 0–120)

## 2023-07-24 LAB — SURGICAL PATHOLOGY

## 2023-07-24 LAB — PHOSPHORUS: Phosphorus: 4.2 mg/dL (ref 2.5–4.6)

## 2023-07-24 LAB — MAGNESIUM: Magnesium: 2.2 mg/dL (ref 1.7–2.4)

## 2023-07-24 MED ORDER — DICYCLOMINE HCL 20 MG PO TABS: 20.0000 mg | ORAL_TABLET | Freq: Three times a day (TID) | ORAL | Status: DC

## 2023-07-24 MED ORDER — DICYCLOMINE HCL 20 MG PO TABS
20.0000 mg | ORAL_TABLET | Freq: Three times a day (TID) | ORAL | Status: DC
  Administered 2023-07-24 – 2023-07-25 (×3): 20 mg via ORAL
  Filled 2023-07-24 (×5): qty 1

## 2023-07-24 MED ORDER — OXYCODONE HCL 5 MG PO TABS
5.0000 mg | ORAL_TABLET | ORAL | Status: DC | PRN
  Administered 2023-07-24 – 2023-07-25 (×2): 5 mg via ORAL
  Filled 2023-07-24 (×2): qty 1

## 2023-07-24 MED ORDER — ALBUTEROL SULFATE (2.5 MG/3ML) 0.083% IN NEBU
2.5000 mg | INHALATION_SOLUTION | RESPIRATORY_TRACT | Status: DC | PRN
  Administered 2023-07-24: 2.5 mg via RESPIRATORY_TRACT
  Filled 2023-07-24: qty 3

## 2023-07-24 NOTE — Progress Notes (Signed)
PHARMACY CONSULT NOTE - ELECTROLYTES  Pharmacy Consult for Electrolyte Monitoring and Replacement   Recent Labs: Height: 5\' 11"  (180.3 cm) Weight: 102 kg (224 lb 13.9 oz) IBW/kg (Calculated) : 75.3 Estimated Creatinine Clearance: 112.6 mL/min (by C-G formula based on SCr of 1.05 mg/dL). Potassium (mmol/L)  Date Value  07/24/2023 3.7   Magnesium (mg/dL)  Date Value  29/56/2130 2.2   Calcium (mg/dL)  Date Value  86/57/8469 8.6 (L)   Albumin (g/dL)  Date Value  62/95/2841 4.1   Phosphorus (mg/dL)  Date Value  32/44/0102 4.2   Sodium (mmol/L)  Date Value  07/24/2023 137   Corrected Ca: 8.4 mg/dL     albumin 4.1  Assessment  William Kane is a 42 y.o. male presenting with Campylobacter+ diarrhea, abd pain. PMH significant for  HTN, mild intermittent asthma,current  ETOH and marjiuana use. Pharmacy has been consulted to monitor and replace electrolytes.  Diet: reg MIVF:  Pertinent medications:    Goal of Therapy: Electrolytes WNL  Plan:  No electrolyte replacement at this time Check BMP, Mg, Phos with AM labs  Thank you for allowing pharmacy to be a part of this patient's care.  Angelique Blonder, PharmD Clinical Pharmacist 07/24/2023 11:09 AM

## 2023-07-24 NOTE — Progress Notes (Signed)
Patient just informed this RN that he "went to go get fresh air." Educated patient on not being able to leave the unit and not to go outside again. Pt verbalized a full understanding stating he was unaware he was unable to walk outside. MD and Charge RN made aware.

## 2023-07-24 NOTE — Progress Notes (Signed)
PROGRESS NOTE    William Kane  ZOX:[id number] DOB: 1981/01/31 DOA: 07/22/2023 PCP: Georganna Skeans, MD    Brief Narrative:  42 year old with history of HTN, asthma comes to the hospital abdominal pain, nausea and vomiting.  CT showed concerns of Crohn's flare versus gastroenteritis therefore admitted.  GI consulted.  Eventually stool studies resulted positive for Campylobacter therefore azithromycin for 3 days was ordered. C scope showed inflammation, bx were taken.    Assessment & Plan:  Principal Problem:   Hypokalemia Active Problems:   Colitis   Inflammatory bowel disease   Abdominal pain nausea vomiting Campylobacter acute gastroenteritis -Continue supportive care currently. Azithromycin 500 mg for 3 days.  Discontinue Cipro.  Supportive care. -GI team has been consulted.  S/p C scope. Bx done. Still having cramping, Diet as tolerated.   Severe hypokalemia -Resolved.  History of asthma -Bronchodilators  Essential hypertension -Slowly resume home medication.  IV as needed  DVT prophylaxis: SCDs Start: 07/22/23 1111 Code Status: Full code Family Communication:  Brother at bedside Ongoing management for acute Campylobacter gastroenteritis Hopefully dc tomorrow if GI symptoms are better.    Subjective: Seen at bedside after EGD.  Doing ok. Still has some cramping but better.    Examination:  General exam: Appears calm and comfortable  Respiratory system: Clear to auscultation. Respiratory effort normal. Cardiovascular system: S1 & S2 heard, RRR. No JVD, murmurs, rubs, gallops or clicks. No pedal edema. Gastrointestinal system: Abdomen is nondistended, soft and nontender. No organomegaly or masses felt. Normal bowel sounds heard. Central nervous system: Alert and oriented. No focal neurological deficits. Extremities: Symmetric 5 x 5 power. Skin: No rashes, lesions or ulcers Psychiatry: Judgement and insight appear normal. Mood & affect appropriate.                 Diet Orders (From admission, onward)     Start     Ordered   07/23/23 1415  Diet regular Room service appropriate? Yes; Fluid consistency: Thin  Diet effective now       Question Answer Comment  Room service appropriate? Yes   Fluid consistency: Thin      07/23/23 1414            Objective: Vitals:   07/23/23 2027 07/24/23 0026 07/24/23 0354 07/24/23 0754  BP: (!) 137/95 114/82 (!) 126/97 (!) 130/96  Pulse: 89 78 79 78  Resp: 18 18 18 17   Temp: 98.2 F (36.8 C) 98.1 F (36.7 C) 97.7 F (36.5 C) 97.6 F (36.4 C)  TempSrc:      SpO2: 95% 99% 99% 98%  Weight:      Height:        Intake/Output Summary (Last 24 hours) at 07/24/2023 0849 Last data filed at 07/23/2023 1321 Gross per 24 hour  Intake 400 ml  Output 0 ml  Net 400 ml   Filed Weights   07/22/23 0800  Weight: 102 kg    Scheduled Meds:  azithromycin  500 mg Oral Daily   Continuous Infusions:  Nutritional status     Body mass index is 31.36 kg/m.  Data Reviewed:   CBC: Recent Labs  Lab 07/22/23 0811 07/24/23 0528  WBC 6.2 4.4  HGB 17.9* 15.9  HCT 50.6 44.9  MCV 93.9 94.7  PLT 258 260   Basic Metabolic Panel: Recent Labs  Lab 07/22/23 0811 07/22/23 1530 07/23/23 0554 07/24/23 0528  NA 134* 135 136 137  K 2.6* 3.6 3.9 3.7  CL 101 103 106 107  CO2 17* 21* 21* 22  GLUCOSE 162* 161* 107* 95  BUN 9 11 7 9   CREATININE 1.02 1.09 0.98 1.05  CALCIUM 8.8* 8.5* 8.4* 8.6*  MG 1.9  --   --  2.2  PHOS 2.4*  --   --  4.2   GFR: Estimated Creatinine Clearance: 112.6 mL/min (by C-G formula based on SCr of 1.05 mg/dL). Liver Function Tests: Recent Labs  Lab 07/22/23 0811  AST 27  ALT 39  ALKPHOS 45  BILITOT 0.7  PROT 7.9  ALBUMIN 4.1   Recent Labs  Lab 07/22/23 0811  LIPASE 21   No results for input(s): "AMMONIA" in the last 168 hours. Coagulation Profile: No results for input(s): "INR", "PROTIME" in the last 168 hours. Cardiac Enzymes: No results  for input(s): "CKTOTAL", "CKMB", "CKMBINDEX", "TROPONINI" in the last 168 hours. BNP (last 3 results) No results for input(s): "PROBNP" in the last 8760 hours. HbA1C: No results for input(s): "HGBA1C" in the last 72 hours. CBG: No results for input(s): "GLUCAP" in the last 168 hours. Lipid Profile: No results for input(s): "CHOL", "HDL", "LDLCALC", "TRIG", "CHOLHDL", "LDLDIRECT" in the last 72 hours. Thyroid Function Tests: No results for input(s): "TSH", "T4TOTAL", "FREET4", "T3FREE", "THYROIDAB" in the last 72 hours. Anemia Panel: No results for input(s): "VITAMINB12", "FOLATE", "FERRITIN", "TIBC", "IRON", "RETICCTPCT" in the last 72 hours. Sepsis Labs: Recent Labs  Lab 07/22/23 1020  LATICACIDVEN 1.9    Recent Results (from the past 240 hour(s))  Gastrointestinal Panel by PCR , Stool     Status: Abnormal   Collection Time: 07/22/23  3:18 PM   Specimen: Stool  Result Value Ref Range Status   Campylobacter species DETECTED (A) NOT DETECTED Final    Comment: RESULT CALLED TO, READ BACK BY AND VERIFIED WITH:  KRISTYN DAVIS 07/22/2023 1657 CP    Plesimonas shigelloides NOT DETECTED NOT DETECTED Final   Salmonella species NOT DETECTED NOT DETECTED Final   Yersinia enterocolitica NOT DETECTED NOT DETECTED Final   Vibrio species NOT DETECTED NOT DETECTED Final   Vibrio cholerae NOT DETECTED NOT DETECTED Final   Enteroaggregative E coli (EAEC) NOT DETECTED NOT DETECTED Final   Enteropathogenic E coli (EPEC) NOT DETECTED NOT DETECTED Final   Enterotoxigenic E coli (ETEC) NOT DETECTED NOT DETECTED Final   Shiga like toxin producing E coli (STEC) NOT DETECTED NOT DETECTED Final   Shigella/Enteroinvasive E coli (EIEC) NOT DETECTED NOT DETECTED Final   Cryptosporidium NOT DETECTED NOT DETECTED Final   Cyclospora cayetanensis NOT DETECTED NOT DETECTED Final   Entamoeba histolytica NOT DETECTED NOT DETECTED Final   Giardia lamblia NOT DETECTED NOT DETECTED Final   Adenovirus F40/41 NOT  DETECTED NOT DETECTED Final   Astrovirus NOT DETECTED NOT DETECTED Final   Norovirus GI/GII NOT DETECTED NOT DETECTED Final   Rotavirus A NOT DETECTED NOT DETECTED Final   Sapovirus (I, II, IV, and V) NOT DETECTED NOT DETECTED Final    Comment: Performed at [facility], [address] [place], [place] [address]  Calprotectin, Fecal     Status: Abnormal   Collection Time: 07/22/23  3:18 PM   Specimen: Stool  Result Value Ref Range Status   Calprotectin, Fecal 408 (H) 0 - 120 ug/g Final    Comment: (NOTE) Concentration     Interpretation   Follow-Up < 5 - 50 ug/g     Normal           None >50 -120 ug/g     Borderline  Re-evaluate in 4-6 weeks    >120 ug/g     Abnormal         Repeat as clinically                                   indicated Performed At: [facility] [address] [place], [place] [id number] Jolene Schimke MD WU:[id number]          Radiology Studies: DG HIPS BILAT WITH PELVIS 2V  Result Date: 07/22/2023 CLINICAL DATA:  42 year old male with pain and vomiting. EXAM: DG HIP (WITH OR WITHOUT PELVIS) 2V BILAT COMPARISON:  CT Abdomen and Pelvis today. FINDINGS: Clothing artifact projects over the central pelvis on some images. Excreted oral contrast in the urinary bladder now. Bone mineralization is within normal limits. Femoral heads normally located. Pelvis intact. Symmetric SI joints. Intact proximal femurs. No acute osseous abnormality identified. Nonobstructed bowel-gas pattern. IMPRESSION: Negative. Electronically Signed   By: Odessa Fleming M.D.   On: 07/22/2023 11:49   DG Lumbar Spine 2-3 Views  Result Date: 07/22/2023 CLINICAL DATA:  42 year old male with pain and vomiting. EXAM: LUMBAR SPINE - 2-3 VIEW COMPARISON:  CT Abdomen and Pelvis 0943 hours today. FINDINGS: Three views of the lumbar spine demonstrate intravenous contrast being excreted from nondilated renal collecting systems and ureters to the bladder. Normal lumbar segmentation.  Stable straightening of lumbar lordosis. Maintained vertebral body height. No acute osseous abnormality identified. Maintained disc spaces. IMPRESSION: Negative. Electronically Signed   By: Odessa Fleming M.D.   On: 07/22/2023 11:48   CT ABDOMEN PELVIS W CONTRAST  Result Date: 07/22/2023 CLINICAL DATA:  Left lower quadrant abdominal pain, nausea, and vomiting EXAM: CT ABDOMEN AND PELVIS WITH CONTRAST TECHNIQUE: Multidetector CT imaging of the abdomen and pelvis was performed using the standard protocol following bolus administration of intravenous contrast. RADIATION DOSE REDUCTION: This exam was performed according to the departmental dose-optimization program which includes automated exposure control, adjustment of the mA and/or kV according to patient size and/or use of iterative reconstruction technique. CONTRAST:  OMNIPAQUE IOHEXOL 300 MG/ML  SOLN COMPARISON:  CT abdomen and pelvis dated 03/06/2012 FINDINGS: Lower chest: No focal consolidation or pulmonary nodule in the lung bases. No pleural effusion or pneumothorax demonstrated. Partially imaged heart size is normal. Hepatobiliary: No focal hepatic lesions. No intra or extrahepatic biliary ductal dilation. Normal gallbladder. Pancreas: No focal lesions or main ductal dilation. Spleen: Normal in size without focal abnormality. Adrenals/Urinary Tract: No adrenal nodules. No suspicious renal mass, calculi or hydronephrosis. No focal bladder wall thickening. Stomach/Bowel: Normal appearance of the stomach. Postsurgical changes from small bowel anastomosis in the right lower quadrant. Anastomosis patent. Long segment mural thickening of the ascending and transverse colon and a focus of short segment mural thickening in the descending colon. Circumferential mural thickening of the terminal ileum. Additional discontinuous segments of small bowel mural thickening in the lower abdomen. No abnormal bowel dilation. Normal appendix. Vascular/Lymphatic: Aortic  atherosclerosis. No enlarged abdominal or pelvic lymph nodes. Reproductive: Prostate is unremarkable. Other: No free fluid, fluid collection, or free air. Musculoskeletal: No acute or abnormal lytic or blastic osseous lesions. Small fat containing right inguinal hernia. 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 IMPRESSION: 1. Discontinuous segment of small and large bowel mural thickening including the terminal ileum, suspicious for inflammatory bowel disease, particularly Crohn's disease. No abnormal dilation, stricturing, or penetrating disease. 2.  Aortic Atherosclerosis (ICD10-I70.0). Electronically Signed  By: Agustin Cree M.D.   On: 07/22/2023 10:01           LOS: 1 day   Time spent= 35 mins    Miguel Rota, MD [facility]  If 7PM-7AM, please contact night-coverage  07/24/2023, 8:49 AM

## 2023-07-25 LAB — CBC
HCT: 43.4 % (ref 39.0–52.0)
Hemoglobin: 15.2 g/dL (ref 13.0–17.0)
MCH: 33.3 pg (ref 26.0–34.0)
MCHC: 35 g/dL (ref 30.0–36.0)
MCV: 95.2 fL (ref 80.0–100.0)
Platelets: 273 10*3/uL (ref 150–400)
RBC: 4.56 MIL/uL (ref 4.22–5.81)
RDW: 12.9 % (ref 11.5–15.5)
WBC: 4.7 10*3/uL (ref 4.0–10.5)
nRBC: 0 % (ref 0.0–0.2)

## 2023-07-25 LAB — MAGNESIUM: Magnesium: 2 mg/dL (ref 1.7–2.4)

## 2023-07-25 LAB — BASIC METABOLIC PANEL
Anion gap: 7 (ref 5–15)
BUN: 10 mg/dL (ref 6–20)
CO2: 25 mmol/L (ref 22–32)
Calcium: 8.5 mg/dL — ABNORMAL LOW (ref 8.9–10.3)
Chloride: 104 mmol/L (ref 98–111)
Creatinine, Ser: 0.98 mg/dL (ref 0.61–1.24)
GFR, Estimated: >60 mL/min (ref >60)
Glucose, Bld: 99 mg/dL (ref 70–99)
Potassium: 3.5 mmol/L (ref 3.5–5.1)
Sodium: 136 mmol/L (ref 135–145)

## 2023-07-25 MED ORDER — ONDANSETRON HCL 4 MG PO TABS: 4.0000 mg | ORAL_TABLET | Freq: Four times a day (QID) | ORAL | 0 refills | Status: AC | PRN

## 2023-07-25 MED ORDER — DICYCLOMINE HCL 20 MG PO TABS: 20.0000 mg | ORAL_TABLET | Freq: Three times a day (TID) | ORAL | 0 refills | Status: AC | PRN

## 2023-07-25 MED ORDER — AZITHROMYCIN 500 MG PO TABS: 500.0000 mg | ORAL_TABLET | Freq: Every day | ORAL | 0 refills | Status: AC

## 2023-07-25 MED ORDER — OXYCODONE HCL 5 MG PO TABS: 5.0000 mg | ORAL_TABLET | Freq: Four times a day (QID) | ORAL | 0 refills | Status: DC | PRN

## 2023-07-25 NOTE — Discharge Summary (Signed)
Physician Discharge Summary  William Kane XBJ:[id number] DOB: 10/28/1980 DOA: 07/22/2023  PCP: Georganna Skeans, MD  Admit date: 07/22/2023 Discharge date: 07/25/2023  Admitted From: Home Disposition: Home  Recommendations for Outpatient Follow-up:  Follow up with PCP in 1-2 weeks   Home Health: No Equipment/Devices: None  Discharge Condition: Stable CODE STATUS: Full Diet recommendation: Soft/bland  Brief/Interim Summary:  42 year old with history of HTN, asthma comes to the hospital abdominal pain, nausea and vomiting.  CT showed concerns of Crohn's flare versus gastroenteritis therefore admitted.  GI consulted.  Eventually stool studies resulted positive for Campylobacter therefore azithromycin for 3 days was ordered. C scope showed inflammation, bx were taken.     Discharge Diagnoses:  Principal Problem:   Hypokalemia Active Problems:   Colitis   Inflammatory bowel disease   Colitis due to Campylobacter species   Abdominal pain nausea vomiting Campylobacter acute gastroenteritis Patient is clinically improved.  He is tolerating p.o. intake.  Will prescribe additional 3 days of azithromycin to complete 7-day course.  As needed Bentyl and oxycodone provided.  As needed Zofran provided.  Recommend soft/bland diet.  Recommend avoidance of exposure to animals or animal products.   Discharge Instructions  Discharge Instructions     Diet - low sodium heart healthy   Complete by: As directed    Increase activity slowly   Complete by: As directed       Allergies as of 07/25/2023       Reactions   Naproxen Nausea Only        Medication List     STOP taking these medications    hydrochlorothiazide 25 MG tablet Commonly known as: HYDRODIURIL   HYDROcodone-acetaminophen 5-325 MG tablet Commonly known as: NORCO/VICODIN   PARoxetine 20 MG tablet Commonly known as: Paxil       TAKE these medications    acetaminophen 500 MG tablet Commonly  known as: TYLENOL Take 1,000 mg by mouth every 6 (six) hours as needed for mild pain or headache.   albuterol 1.25 MG/3ML nebulizer solution Commonly known as: ACCUNEB Take 1 ampule by nebulization every 6 (six) hours as needed for wheezing.   albuterol 108 (90 Base) MCG/ACT inhaler Commonly known as: VENTOLIN HFA Inhale 1-2 puffs into the lungs every 6 (six) hours as needed for wheezing or shortness of breath.   azithromycin 500 MG tablet Commonly known as: ZITHROMAX Take 1 tablet (500 mg total) by mouth daily for 4 days.   dicyclomine 20 MG tablet Commonly known as: BENTYL Take 1 tablet (20 mg total) by mouth 3 (three) times daily as needed for spasms.   ondansetron 4 MG tablet Commonly known as: ZOFRAN Take 1 tablet (4 mg total) by mouth every 6 (six) hours as needed for nausea.   oxyCODONE 5 MG immediate release tablet Commonly known as: Oxy IR/ROXICODONE Take 1 tablet (5 mg total) by mouth every 6 (six) hours as needed for severe pain (pain score 7-10) or breakthrough pain.        Allergies  Allergen Reactions   Naproxen Nausea Only    Consultations: GI   Procedures/Studies: DG HIPS BILAT WITH PELVIS 2V  Result Date: 07/22/2023 CLINICAL DATA:  42 year old male with pain and vomiting. EXAM: DG HIP (WITH OR WITHOUT PELVIS) 2V BILAT COMPARISON:  CT Abdomen and Pelvis today. FINDINGS: Clothing artifact projects over the central pelvis on some images. Excreted oral contrast in the urinary bladder now. Bone mineralization is within normal limits. Femoral heads normally located. Pelvis intact. Symmetric  SI joints. Intact proximal femurs. No acute osseous abnormality identified. Nonobstructed bowel-gas pattern. IMPRESSION: Negative. Electronically Signed   By: Odessa Fleming M.D.   On: 07/22/2023 11:49   DG Lumbar Spine 2-3 Views  Result Date: 07/22/2023 CLINICAL DATA:  42 year old male with pain and vomiting. EXAM: LUMBAR SPINE - 2-3 VIEW COMPARISON:  CT Abdomen and Pelvis  0943 hours today. FINDINGS: Three views of the lumbar spine demonstrate intravenous contrast being excreted from nondilated renal collecting systems and ureters to the bladder. Normal lumbar segmentation. Stable straightening of lumbar lordosis. Maintained vertebral body height. No acute osseous abnormality identified. Maintained disc spaces. IMPRESSION: Negative. Electronically Signed   By: Odessa Fleming M.D.   On: 07/22/2023 11:48   CT ABDOMEN PELVIS W CONTRAST  Result Date: 07/22/2023 CLINICAL DATA:  Left lower quadrant abdominal pain, nausea, and vomiting EXAM: CT ABDOMEN AND PELVIS WITH CONTRAST TECHNIQUE: Multidetector CT imaging of the abdomen and pelvis was performed using the standard protocol following bolus administration of intravenous contrast. RADIATION DOSE REDUCTION: This exam was performed according to the departmental dose-optimization program which includes automated exposure control, adjustment of the mA and/or kV according to patient size and/or use of iterative reconstruction technique. CONTRAST:  OMNIPAQUE IOHEXOL 300 MG/ML  SOLN COMPARISON:  CT abdomen and pelvis dated 03/06/2012 FINDINGS: Lower chest: No focal consolidation or pulmonary nodule in the lung bases. No pleural effusion or pneumothorax demonstrated. Partially imaged heart size is normal. Hepatobiliary: No focal hepatic lesions. No intra or extrahepatic biliary ductal dilation. Normal gallbladder. Pancreas: No focal lesions or main ductal dilation. Spleen: Normal in size without focal abnormality. Adrenals/Urinary Tract: No adrenal nodules. No suspicious renal mass, calculi or hydronephrosis. No focal bladder wall thickening. Stomach/Bowel: Normal appearance of the stomach. Postsurgical changes from small bowel anastomosis in the right lower quadrant. Anastomosis patent. Long segment mural thickening of the ascending and transverse colon and a focus of short segment mural thickening in the descending colon. Circumferential  mural thickening of the terminal ileum. Additional discontinuous segments of small bowel mural thickening in the lower abdomen. No abnormal bowel dilation. Normal appendix. Vascular/Lymphatic: Aortic atherosclerosis. No enlarged abdominal or pelvic lymph nodes. Reproductive: Prostate is unremarkable. Other: No free fluid, fluid collection, or free air. Musculoskeletal: No acute or abnormal lytic or blastic osseous lesions. Small fat containing right inguinal hernia. 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 IMPRESSION: 1. Discontinuous segment of small and large bowel mural thickening including the terminal ileum, suspicious for inflammatory bowel disease, particularly Crohn's disease. No abnormal dilation, stricturing, or penetrating disease. 2.  Aortic Atherosclerosis (ICD10-I70.0). Electronically Signed   By: Agustin Cree M.D.   On: 07/22/2023 10:01      Subjective: Seen and examined.  Tolerating p.o. intake at time of discharge.  Stable for discharge home.  Discharge Exam: Vitals:   07/25/23 0515 07/25/23 0731  BP: (!) 134/93 121/86  Pulse: 75 75  Resp: 19 16  Temp: 98.3 F (36.8 C) 98.2 F (36.8 C)  SpO2: 100% 99%   Vitals:   07/24/23 1642 07/24/23 2037 07/25/23 0515 07/25/23 0731  BP: 123/80 (!) 129/92 (!) 134/93 121/86  Pulse: 75 84 75 75  Resp: 14 18 19 16   Temp: 98.7 F (37.1 C) 98.4 F (36.9 C) 98.3 F (36.8 C) 98.2 F (36.8 C)  TempSrc:      SpO2: 99% 96% 100% 99%  Weight:      Height:        General: Pt is alert, awake, not  in acute distress Cardiovascular: RRR, S1/S2 +, no rubs, no gallops Respiratory: CTA bilaterally, no wheezing, no rhonchi Abdominal: Soft, NT, ND, bowel sounds + Extremities: no edema, no cyanosis    The results of significant diagnostics from this hospitalization (including imaging, microbiology, ancillary and laboratory) are listed below for reference.     Microbiology: Recent Results (from the past 240 hour(s))  Gastrointestinal  Panel by PCR , Stool     Status: Abnormal   Collection Time: 07/22/23  3:18 PM   Specimen: Stool  Result Value Ref Range Status   Campylobacter species DETECTED (A) NOT DETECTED Final    Comment: RESULT CALLED TO, READ BACK BY AND VERIFIED WITH:  KRISTYN DAVIS 07/22/2023 1657 CP    Plesimonas shigelloides NOT DETECTED NOT DETECTED Final   Salmonella species NOT DETECTED NOT DETECTED Final   Yersinia enterocolitica NOT DETECTED NOT DETECTED Final   Vibrio species NOT DETECTED NOT DETECTED Final   Vibrio cholerae NOT DETECTED NOT DETECTED Final   Enteroaggregative E coli (EAEC) NOT DETECTED NOT DETECTED Final   Enteropathogenic E coli (EPEC) NOT DETECTED NOT DETECTED Final   Enterotoxigenic E coli (ETEC) NOT DETECTED NOT DETECTED Final   Shiga like toxin producing E coli (STEC) NOT DETECTED NOT DETECTED Final   Shigella/Enteroinvasive E coli (EIEC) NOT DETECTED NOT DETECTED Final   Cryptosporidium NOT DETECTED NOT DETECTED Final   Cyclospora cayetanensis NOT DETECTED NOT DETECTED Final   Entamoeba histolytica NOT DETECTED NOT DETECTED Final   Giardia lamblia NOT DETECTED NOT DETECTED Final   Adenovirus F40/41 NOT DETECTED NOT DETECTED Final   Astrovirus NOT DETECTED NOT DETECTED Final   Norovirus GI/GII NOT DETECTED NOT DETECTED Final   Rotavirus A NOT DETECTED NOT DETECTED Final   Sapovirus (I, II, IV, and V) NOT DETECTED NOT DETECTED Final    Comment: Performed at [facility], [address] [place], [place] [address]  Calprotectin, Fecal     Status: Abnormal   Collection Time: 07/22/23  3:18 PM   Specimen: Stool  Result Value Ref Range Status   Calprotectin, Fecal 408 (H) 0 - 120 ug/g Final    Comment: (NOTE) Concentration     Interpretation   Follow-Up < 5 - 50 ug/g     Normal           None >50 -120 ug/g     Borderline       Re-evaluate in 4-6 weeks    >120 ug/g     Abnormal         Repeat as clinically                                   indicated Performed At: [facility] [address] [place], [place] [id number] Jolene Schimke MD SW:[id number]      Labs: BNP (last 3 results) No results for input(s): "BNP" in the last 8760 hours. Basic Metabolic Panel: Recent Labs  Lab 07/22/23 0811 07/22/23 1530 07/23/23 0554 07/24/23 0528 07/25/23 0539  NA 134* 135 136 137 136  K 2.6* 3.6 3.9 3.7 3.5  CL 101 103 106 107 104  CO2 17* 21* 21* 22 25  GLUCOSE 162* 161* 107* 95 99  BUN 9 11 7 9 10   CREATININE 1.02 1.09 0.98 1.05 0.98  CALCIUM 8.8* 8.5* 8.4* 8.6* 8.5*  MG 1.9  --   --  2.2 2.0  PHOS 2.4*  --   --  4.2  --    Liver Function Tests: Recent Labs  Lab 07/22/23 0811  AST 27  ALT 39  ALKPHOS 45  BILITOT 0.7  PROT 7.9  ALBUMIN 4.1   Recent Labs  Lab 07/22/23 0811  LIPASE 21   No results for input(s): "AMMONIA" in the last 168 hours. CBC: Recent Labs  Lab 07/22/23 0811 07/24/23 0528 07/25/23 0539  WBC 6.2 4.4 4.7  HGB 17.9* 15.9 15.2  HCT 50.6 44.9 43.4  MCV 93.9 94.7 95.2  PLT 258 260 273   Cardiac Enzymes: No results for input(s): "CKTOTAL", "CKMB", "CKMBINDEX", "TROPONINI" in the last 168 hours. BNP: Invalid input(s): "POCBNP" CBG: No results for input(s): "GLUCAP" in the last 168 hours. D-Dimer No results for input(s): "DDIMER" in the last 72 hours. Hgb A1c No results for input(s): "HGBA1C" in the last 72 hours. Lipid Profile No results for input(s): "CHOL", "HDL", "LDLCALC", "TRIG", "CHOLHDL", "LDLDIRECT" in the last 72 hours. Thyroid function studies No results for input(s): "TSH", "T4TOTAL", "T3FREE", "THYROIDAB" in the last 72 hours.  Invalid input(s): "FREET3" Anemia work up No results for input(s): "VITAMINB12", "FOLATE", "FERRITIN", "TIBC", "IRON", "RETICCTPCT" in the last 72 hours. Urinalysis    Component Value Date/Time   COLORURINE YELLOW (A) 07/22/2023 0811   APPEARANCEUR CLEAR (A) 07/22/2023 0811   LABSPEC >1.046 (H) 07/22/2023 0811   PHURINE 6.0 07/22/2023 0811   GLUCOSEU NEGATIVE  07/22/2023 0811   HGBUR NEGATIVE 07/22/2023 0811   BILIRUBINUR NEGATIVE 07/22/2023 0811   KETONESUR NEGATIVE 07/22/2023 0811   PROTEINUR NEGATIVE 07/22/2023 0811   UROBILINOGEN 1.0 01/06/2013 1708   NITRITE NEGATIVE 07/22/2023 0811   LEUKOCYTESUR NEGATIVE 07/22/2023 0811   Sepsis Labs Recent Labs  Lab 07/22/23 0811 07/24/23 0528 07/25/23 0539  WBC 6.2 4.4 4.7   Microbiology Recent Results (from the past 240 hour(s))  Gastrointestinal Panel by PCR , Stool     Status: Abnormal   Collection Time: 07/22/23  3:18 PM   Specimen: Stool  Result Value Ref Range Status   Campylobacter species DETECTED (A) NOT DETECTED Final    Comment: RESULT CALLED TO, READ BACK BY AND VERIFIED WITH:  KRISTYN DAVIS 07/22/2023 1657 CP    Plesimonas shigelloides NOT DETECTED NOT DETECTED Final   Salmonella species NOT DETECTED NOT DETECTED Final   Yersinia enterocolitica NOT DETECTED NOT DETECTED Final   Vibrio species NOT DETECTED NOT DETECTED Final   Vibrio cholerae NOT DETECTED NOT DETECTED Final   Enteroaggregative E coli (EAEC) NOT DETECTED NOT DETECTED Final   Enteropathogenic E coli (EPEC) NOT DETECTED NOT DETECTED Final   Enterotoxigenic E coli (ETEC) NOT DETECTED NOT DETECTED Final   Shiga like toxin producing E coli (STEC) NOT DETECTED NOT DETECTED Final   Shigella/Enteroinvasive E coli (EIEC) NOT DETECTED NOT DETECTED Final   Cryptosporidium NOT DETECTED NOT DETECTED Final   Cyclospora cayetanensis NOT DETECTED NOT DETECTED Final   Entamoeba histolytica NOT DETECTED NOT DETECTED Final   Giardia lamblia NOT DETECTED NOT DETECTED Final   Adenovirus F40/41 NOT DETECTED NOT DETECTED Final   Astrovirus NOT DETECTED NOT DETECTED Final   Norovirus GI/GII NOT DETECTED NOT DETECTED Final   Rotavirus A NOT DETECTED NOT DETECTED Final   Sapovirus (I, II, IV, and V) NOT DETECTED NOT DETECTED Final    Comment: Performed at [facility], [address] [place], [place] [address]   Calprotectin, Fecal     Status: Abnormal   Collection Time: 07/22/23  3:18 PM   Specimen: Stool  Result  Value Ref Range Status   Calprotectin, Fecal 408 (H) 0 - 120 ug/g Final    Comment: (NOTE) Concentration     Interpretation   Follow-Up < 5 - 50 ug/g     Normal           None >50 -120 ug/g     Borderline       Re-evaluate in 4-6 weeks    >120 ug/g     Abnormal         Repeat as clinically                                   indicated Performed At: [facility] [address] [place], [place] [id number] Jolene Schimke MD WU:[id number]      Time coordinating discharge: Over 30 minutes  SIGNED:   Tresa Moore, MD  [facility] 07/25/2023, 10:47 AM Pager   If 7PM-7AM, please contact night-coverage

## 2023-07-25 NOTE — Progress Notes (Signed)
PHARMACY CONSULT NOTE - ELECTROLYTES  Pharmacy Consult for Electrolyte Monitoring and Replacement   Recent Labs: Height: 5\' 11"  (180.3 cm) Weight: 102 kg (224 lb 13.9 oz) IBW/kg (Calculated) : 75.3 Estimated Creatinine Clearance: 120.7 mL/min (by C-G formula based on SCr of 0.98 mg/dL). Potassium (mmol/L)  Date Value  07/25/2023 3.5   Magnesium (mg/dL)  Date Value  54/04/8118 2.0   Calcium (mg/dL)  Date Value  14/78/2956 8.5 (L)   Albumin (g/dL)  Date Value  21/30/8657 4.1   Phosphorus (mg/dL)  Date Value  84/69/6295 4.2   Sodium (mmol/L)  Date Value  07/25/2023 136   Assessment  William Kane is a 42 y.o. male presenting with Campylobacter+ diarrhea, abd pain. PMH significant for  HTN, mild intermittent asthma,current  ETOH and marjiuana use. Pharmacy has been consulted to monitor and replace electrolytes.  Diet: Regular MIVF: N/A Pertinent medications:  N/A  Goal of Therapy: Electrolytes WNL  Plan:  No electrolyte replacement at this time Defer labs for tomorrow. Consider checking [phone]  Thank you for allowing pharmacy to be a part of this patient's care.  Tressie Ellis 07/25/2023 7:49 AM

## 2023-07-25 NOTE — Progress Notes (Signed)
PROGRESS NOTE    Deluca Cloos III  JXB:[id number] DOB: 1981-03-14 DOA: 07/22/2023 PCP: Georganna Skeans, MD    Brief Narrative:   42 year old with history of HTN, asthma comes to the hospital abdominal pain, nausea and vomiting.  CT showed concerns of Crohn's flare versus gastroenteritis therefore admitted.  GI consulted.  Eventually stool studies resulted positive for Campylobacter therefore azithromycin for 3 days was ordered. C scope showed inflammation, bx were taken.      Assessment & Plan:   Principal Problem:   Hypokalemia Active Problems:   Colitis   Inflammatory bowel disease   Colitis due to Campylobacter species  Abdominal pain nausea vomiting Campylobacter acute gastroenteritis Status post colonoscopy and biopsy.  No evidence of Crohn's endoscopically. Plan: Azithromycin Pain control Nausea control Discharge once pain control achieved  Severe hypokalemia -Resolved.   History of asthma -Bronchodilators   Essential hypertension -Slowly resume home medication.  IV as needed     DVT prophylaxis: Lovenox Code Status: Full Family Communication: None Disposition Plan: Inpatient.  Still with abdominal pain   Level of care: Telemetry Medical  Consultants:  GI  Procedures:  Colonoscopy  Antimicrobials: Azithromycin   Subjective: Seen and examined.  Still having abdominal cramping.  Objective: Vitals:   07/24/23 1642 07/24/23 2037 07/25/23 0515 07/25/23 0731  BP: 123/80 (!) 129/92 (!) 134/93 121/86  Pulse: 75 84 75 75  Resp: 14 18 19 16   Temp: 98.7 F (37.1 C) 98.4 F (36.9 C) 98.3 F (36.8 C) 98.2 F (36.8 C)  TempSrc:      SpO2: 99% 96% 100% 99%  Weight:      Height:       No intake or output data in the 24 hours ending 07/25/23 1049 Filed Weights   07/22/23 0800  Weight: 102 kg    Examination:  General exam: Appears calm and comfortable  Respiratory system: Clear to auscultation. Respiratory effort  normal. Cardiovascular system: S1-S2, RRR, no murmurs, no pedal edema Gastrointestinal system: Soft, mild distention, mild tender to palpation, hyperactive bowel sounds Central nervous system: Alert and oriented. No focal neurological deficits. Extremities: Symmetric 5 x 5 power. Skin: No rashes, lesions or ulcers Psychiatry: Judgement and insight appear normal. Mood & affect appropriate.     Data Reviewed: I have personally reviewed following labs and imaging studies  CBC: Recent Labs  Lab 07/22/23 0811 07/24/23 0528 07/25/23 0539  WBC 6.2 4.4 4.7  HGB 17.9* 15.9 15.2  HCT 50.6 44.9 43.4  MCV 93.9 94.7 95.2  PLT 258 260 273   Basic Metabolic Panel: Recent Labs  Lab 07/22/23 0811 07/22/23 1530 07/23/23 0554 07/24/23 0528 07/25/23 0539  NA 134* 135 136 137 136  K 2.6* 3.6 3.9 3.7 3.5  CL 101 103 106 107 104  CO2 17* 21* 21* 22 25  GLUCOSE 162* 161* 107* 95 99  BUN 9 11 7 9 10   CREATININE 1.02 1.09 0.98 1.05 0.98  CALCIUM 8.8* 8.5* 8.4* 8.6* 8.5*  MG 1.9  --   --  2.2 2.0  PHOS 2.4*  --   --  4.2  --    GFR: Estimated Creatinine Clearance: 120.7 mL/min (by C-G formula based on SCr of 0.98 mg/dL). Liver Function Tests: Recent Labs  Lab 07/22/23 0811  AST 27  ALT 39  ALKPHOS 45  BILITOT 0.7  PROT 7.9  ALBUMIN 4.1   Recent Labs  Lab 07/22/23 0811  LIPASE 21   No results for input(s): "AMMONIA" in the last  168 hours. Coagulation Profile: No results for input(s): "INR", "PROTIME" in the last 168 hours. Cardiac Enzymes: No results for input(s): "CKTOTAL", "CKMB", "CKMBINDEX", "TROPONINI" in the last 168 hours. BNP (last 3 results) No results for input(s): "PROBNP" in the last 8760 hours. HbA1C: No results for input(s): "HGBA1C" in the last 72 hours. CBG: No results for input(s): "GLUCAP" in the last 168 hours. Lipid Profile: No results for input(s): "CHOL", "HDL", "LDLCALC", "TRIG", "CHOLHDL", "LDLDIRECT" in the last 72 hours. Thyroid Function  Tests: No results for input(s): "TSH", "T4TOTAL", "FREET4", "T3FREE", "THYROIDAB" in the last 72 hours. Anemia Panel: No results for input(s): "VITAMINB12", "FOLATE", "FERRITIN", "TIBC", "IRON", "RETICCTPCT" in the last 72 hours. Sepsis Labs: Recent Labs  Lab 07/22/23 1020  LATICACIDVEN 1.9    Recent Results (from the past 240 hour(s))  Gastrointestinal Panel by PCR , Stool     Status: Abnormal   Collection Time: 07/22/23  3:18 PM   Specimen: Stool  Result Value Ref Range Status   Campylobacter species DETECTED (A) NOT DETECTED Final    Comment: RESULT CALLED TO, READ BACK BY AND VERIFIED WITH:  KRISTYN DAVIS 07/22/2023 1657 CP    Plesimonas shigelloides NOT DETECTED NOT DETECTED Final   Salmonella species NOT DETECTED NOT DETECTED Final   Yersinia enterocolitica NOT DETECTED NOT DETECTED Final   Vibrio species NOT DETECTED NOT DETECTED Final   Vibrio cholerae NOT DETECTED NOT DETECTED Final   Enteroaggregative E coli (EAEC) NOT DETECTED NOT DETECTED Final   Enteropathogenic E coli (EPEC) NOT DETECTED NOT DETECTED Final   Enterotoxigenic E coli (ETEC) NOT DETECTED NOT DETECTED Final   Shiga like toxin producing E coli (STEC) NOT DETECTED NOT DETECTED Final   Shigella/Enteroinvasive E coli (EIEC) NOT DETECTED NOT DETECTED Final   Cryptosporidium NOT DETECTED NOT DETECTED Final   Cyclospora cayetanensis NOT DETECTED NOT DETECTED Final   Entamoeba histolytica NOT DETECTED NOT DETECTED Final   Giardia lamblia NOT DETECTED NOT DETECTED Final   Adenovirus F40/41 NOT DETECTED NOT DETECTED Final   Astrovirus NOT DETECTED NOT DETECTED Final   Norovirus GI/GII NOT DETECTED NOT DETECTED Final   Rotavirus A NOT DETECTED NOT DETECTED Final   Sapovirus (I, II, IV, and V) NOT DETECTED NOT DETECTED Final    Comment: Performed at [facility], [address] [place], [place] [address]  Calprotectin, Fecal     Status: Abnormal   Collection Time: 07/22/23  3:18 PM   Specimen: Stool   Result Value Ref Range Status   Calprotectin, Fecal 408 (H) 0 - 120 ug/g Final    Comment: (NOTE) Concentration     Interpretation   Follow-Up < 5 - 50 ug/g     Normal           None >50 -120 ug/g     Borderline       Re-evaluate in 4-6 weeks    >120 ug/g     Abnormal         Repeat as clinically                                   indicated Performed At: [facility] [address] [place], [place] [id number] Jolene Schimke MD AO:[id number]          Radiology Studies: No results found.      Scheduled Meds:  dicyclomine  20 mg Oral TID AC & HS   Continuous Infusions:  LOS: 2 days      Tresa Moore, MD [facility]   If 7PM-7AM, please contact night-coverage  07/25/2023, 10:49 AM

## 2023-07-26 ENCOUNTER — Telehealth: Payer: Self-pay

## 2023-07-26 NOTE — Transitions of Care (Post Inpatient/ED Visit) (Signed)
   07/26/2023  Name: William Kane MRN: [id number] DOB: 11/15/1980  Today's TOC FU Call Status: Today's TOC FU Call Status:: Unsuccessful Call (1st Attempt) Unsuccessful Call (1st Attempt) Date: 07/26/23  Attempted to reach the patient regarding the most recent Inpatient/ED visit.  Follow Up Plan: Additional outreach attempts will be made to reach the patient to complete the Transitions of Care (Post Inpatient/ED visit) call.   Abby Elenora Hawbaker, CMA  [facility] AWV Team Direct Dial: [phone]

## 2023-07-29 NOTE — Anesthesia Postprocedure Evaluation (Signed)
Anesthesia Post Note  Patient: William Kane  Procedure(s) Performed: COLONOSCOPY WITH PROPOFOL  Patient location during evaluation: Endoscopy Anesthesia Type: General Level of consciousness: awake and alert Pain management: pain level controlled Vital Signs Assessment: post-procedure vital signs reviewed and stable Respiratory status: spontaneous breathing, nonlabored ventilation, respiratory function stable and patient connected to nasal cannula oxygen Cardiovascular status: blood pressure returned to baseline and stable Postop Assessment: no apparent nausea or vomiting Anesthetic complications: no   No notable events documented.   Last Vitals:  Vitals:   07/25/23 0515 07/25/23 0731  BP: (!) 134/93 121/86  Pulse: 75 75  Resp: 19 16  Temp: 36.8 C 36.8 C  SpO2: 100% 99%    Last Pain:  Vitals:   07/25/23 0834  TempSrc:   PainSc: 6                  William Kane

## 2023-08-05 ENCOUNTER — Other Ambulatory Visit: Payer: Self-pay

## 2023-08-05 ENCOUNTER — Emergency Department: Payer: Self-pay

## 2023-08-05 ENCOUNTER — Encounter: Payer: Self-pay | Admitting: Emergency Medicine

## 2023-08-05 ENCOUNTER — Emergency Department
Admission: EM | Admit: 2023-08-05 | Discharge: 2023-08-05 | Disposition: A | Discharge status: Home / Self Care | Payer: Self-pay | Attending: Emergency Medicine | Admitting: Emergency Medicine

## 2023-08-05 DIAGNOSIS — M109 Gout, unspecified: Secondary | ICD-10-CM | POA: Diagnosis not present

## 2023-08-05 MED ORDER — KETOROLAC TROMETHAMINE 30 MG/ML IJ SOLN
30.0000 mg | Freq: Once | INTRAMUSCULAR | Status: AC
  Administered 2023-08-05: 30 mg via INTRAMUSCULAR
  Filled 2023-08-05: qty 1

## 2023-08-05 MED ORDER — MELOXICAM 15 MG PO TABS: 15.0000 mg | ORAL_TABLET | Freq: Every day | ORAL | 2 refills | Status: DC

## 2023-08-05 MED ORDER — OXYCODONE-ACETAMINOPHEN 5-325 MG PO TABS
1.0000 | ORAL_TABLET | Freq: Once | ORAL | Status: AC
  Administered 2023-08-05: 1 via ORAL
  Filled 2023-08-05: qty 1

## 2023-08-05 MED ORDER — OXYCODONE-ACETAMINOPHEN 5-325 MG PO TABS: 1.0000 | ORAL_TABLET | Freq: Three times a day (TID) | ORAL | 0 refills | Status: DC | PRN

## 2023-08-05 NOTE — ED Triage Notes (Signed)
Patient to ED via POV for right ankle pain. Started during the night. No known injury. States unable to place weight on it due to pain. Hx of foot drop

## 2023-08-05 NOTE — ED Provider Notes (Signed)
[facility] Provider Note    Event Date/Time   First MD Initiated Contact with Patient 08/05/23 1029     (approximate)   History   Ankle Pain   HPI  William Kane is a 42 y.o. male with history of right sided foot drop who presents with complaints of right ankle pain which he woke up with.  He denies injury to the area.  Denies a history of gout.  Does have alcohol daily but reports moderate amounts.  No fevers.  He reports any movement is painful     Physical Exam   Triage Vital Signs: ED Triage Vitals  Encounter Vitals Group     BP 08/05/23 0922 (!) 139/96     Systolic BP Percentile --      Diastolic BP Percentile --      Pulse Rate 08/05/23 0922 91     Resp 08/05/23 0922 18     Temp 08/05/23 0922 98.4 F (36.9 C)     Temp Source 08/05/23 0922 Oral     SpO2 08/05/23 0922 100 %     Weight 08/05/23 0923 97.1 kg (214 lb)     Height 08/05/23 0923 1.778 m (5\' 10" )     Head Circumference --      Peak Flow --      Pain Score 08/05/23 0923 10     Pain Loc --      Pain Education --      Exclude from Growth Chart --     Most recent vital signs: Vitals:   08/05/23 0922  BP: (!) 139/96  Pulse: 91  Resp: 18  Temp: 98.4 F (36.9 C)  SpO2: 100%     General: Awake, no distress.  CV:  Good peripheral perfusion.  Resp:  Normal effort.  Abd:  No distention.  Other:  Ankle: No significant swelling, no drainable fluid collection noted, no erythema, foot is warm and well-perfused with normal pulses   ED Results / Procedures / Treatments   Labs (all labs ordered are listed, but only abnormal results are displayed) Labs Reviewed - No data to display   EKG     RADIOLOGY X-ray viewed interpret by me, no acute abnormality confirmed by radiology    PROCEDURES:  Critical Care performed:   Procedures   MEDICATIONS ORDERED IN ED: Medications  ketorolac (TORADOL) 30 MG/ML injection 30 mg (30 mg Intramuscular Given 08/05/23  1119)  oxyCODONE-acetaminophen (PERCOCET/ROXICET) 5-325 MG per tablet 1 tablet (1 tablet Oral Given 08/05/23 1119)     IMPRESSION / MDM / ASSESSMENT AND PLAN / ED COURSE  I reviewed the triage vital signs and the nursing notes. Patient's presentation is most consistent with acute complicated illness / injury requiring diagnostic workup.  Patient presents with right ankle pain as detailed above, suspicious for gout given no trauma, no signs of infection, normal pulses distally.  Will treat with IM Toradol, Percocet, prescriptions provided, work note, outpatient follow-up with PCP recommended        FINAL CLINICAL IMPRESSION(S) / ED DIAGNOSES   Final diagnoses:  Acute gout of right ankle, unspecified cause     Rx / DC Orders   ED Discharge Orders          Ordered    meloxicam (MOBIC) 15 MG tablet  Daily        08/05/23 1045    oxyCODONE-acetaminophen (PERCOCET) 5-325 MG tablet  Every 8 hours PRN  08/05/23 1045             Note:  This document was prepared using Dragon voice recognition software and may include unintentional dictation errors.   Jene Every, MD 08/05/23 [phone]

## 2023-08-05 NOTE — ED Provider Triage Note (Signed)
Emergency Medicine Provider Triage Evaluation Note  William Kane , a 42 y.o. male  was evaluated in triage.  Pt complains of ankle pain. Pt reports history of foot drop. No recent injuries, no history of gout.   Review of Systems  Positive: Ankle pain Negative:   Physical Exam  BP (!) 139/96   Pulse 91   Temp 98.4 F (36.9 C) (Oral)   Resp 18   Ht 5\' 10"  (1.778 m)   Wt 97.1 kg   SpO2 100%   BMI 30.71 kg/m  Gen:   Awake, no distress   Resp:  Normal effort  MSK:   Moves extremities without difficulty Other:  Very tender to palpation over the ankle joint and foot  Medical Decision Making  Medically screening exam initiated at 9:23 AM.  Appropriate orders placed.  William Kane was informed that the remainder of the evaluation will be completed by another provider, this initial triage assessment does not replace that evaluation, and the importance of remaining in the ED until their evaluation is complete.     Cameron Ali, PA-C 08/05/23 [phone]

## 2023-08-05 NOTE — ED Notes (Signed)
See triage note  Presents with pain to right ankle   Denies any injury  States he woke up like this

## 2023-08-14 LAB — MISCELLANEOUS TEST

## 2023-08-28 DIAGNOSIS — Z419 Encounter for procedure for purposes other than remedying health state, unspecified: Secondary | ICD-10-CM | POA: Diagnosis not present

## 2023-09-04 ENCOUNTER — Emergency Department: Payer: Medicaid Other

## 2023-09-04 ENCOUNTER — Other Ambulatory Visit: Payer: Self-pay

## 2023-09-04 ENCOUNTER — Emergency Department
Admission: EM | Admit: 2023-09-04 | Discharge: 2023-09-04 | Discharge status: Against Medical Advice | Payer: Medicaid Other | Attending: Student | Admitting: Student

## 2023-09-04 DIAGNOSIS — R0981 Nasal congestion: Secondary | ICD-10-CM | POA: Diagnosis not present

## 2023-09-04 DIAGNOSIS — R059 Cough, unspecified: Secondary | ICD-10-CM | POA: Diagnosis not present

## 2023-09-04 DIAGNOSIS — R062 Wheezing: Secondary | ICD-10-CM | POA: Insufficient documentation

## 2023-09-04 DIAGNOSIS — J101 Influenza due to other identified influenza virus with other respiratory manifestations: Secondary | ICD-10-CM | POA: Diagnosis not present

## 2023-09-04 DIAGNOSIS — Z5321 Procedure and treatment not carried out due to patient leaving prior to being seen by health care provider: Secondary | ICD-10-CM | POA: Diagnosis not present

## 2023-09-04 DIAGNOSIS — R0602 Shortness of breath: Secondary | ICD-10-CM | POA: Insufficient documentation

## 2023-09-04 DIAGNOSIS — R519 Headache, unspecified: Secondary | ICD-10-CM | POA: Diagnosis not present

## 2023-09-04 DIAGNOSIS — R0989 Other specified symptoms and signs involving the circulatory and respiratory systems: Secondary | ICD-10-CM | POA: Diagnosis not present

## 2023-09-04 DIAGNOSIS — Z20822 Contact with and (suspected) exposure to covid-19: Secondary | ICD-10-CM | POA: Insufficient documentation

## 2023-09-04 LAB — CBC WITH DIFFERENTIAL/PLATELET
Abs Immature Granulocytes: 0.02 10*3/uL (ref 0.00–0.07)
Basophils Absolute: 0.1 10*3/uL (ref 0.0–0.1)
Basophils Relative: 1 %
Eosinophils Absolute: 0.4 10*3/uL (ref 0.0–0.5)
Eosinophils Relative: 7 %
HCT: 43 % (ref 39.0–52.0)
Hemoglobin: 14.8 g/dL (ref 13.0–17.0)
Immature Granulocytes: 0 %
Lymphocytes Relative: 40 %
Lymphs Abs: 2.6 10*3/uL (ref 0.7–4.0)
MCH: 33.2 pg (ref 26.0–34.0)
MCHC: 34.4 g/dL (ref 30.0–36.0)
MCV: 96.4 fL (ref 80.0–100.0)
Monocytes Absolute: 1 10*3/uL (ref 0.1–1.0)
Monocytes Relative: 15 %
Neutro Abs: 2.4 10*3/uL (ref 1.7–7.7)
Neutrophils Relative %: 37 %
Platelets: 328 10*3/uL (ref 150–400)
RBC: 4.46 MIL/uL (ref 4.22–5.81)
RDW: 13.3 % (ref 11.5–15.5)
WBC: 6.5 10*3/uL (ref 4.0–10.5)
nRBC: 0 % (ref 0.0–0.2)

## 2023-09-04 LAB — RESP PANEL BY RT-PCR (RSV, FLU A&B, COVID)  RVPGX2
Influenza A by PCR: NEGATIVE
Influenza B by PCR: NEGATIVE
Resp Syncytial Virus by PCR: NEGATIVE
SARS Coronavirus 2 by RT PCR: NEGATIVE

## 2023-09-04 MED ORDER — OXYCODONE-ACETAMINOPHEN 5-325 MG PO TABS
1.0000 | ORAL_TABLET | Freq: Once | ORAL | Status: AC
  Administered 2023-09-04: 1 via ORAL
  Filled 2023-09-04: qty 1

## 2023-09-04 MED ORDER — DEXAMETHASONE SODIUM PHOSPHATE 10 MG/ML IJ SOLN
10.0000 mg | Freq: Once | INTRAMUSCULAR | Status: AC
  Administered 2023-09-04: 10 mg via INTRAMUSCULAR
  Filled 2023-09-04: qty 1

## 2023-09-04 MED ORDER — IPRATROPIUM-ALBUTEROL 0.5-2.5 (3) MG/3ML IN SOLN
3.0000 mL | Freq: Once | RESPIRATORY_TRACT | Status: AC
  Administered 2023-09-04: 3 mL via RESPIRATORY_TRACT
  Filled 2023-09-04: qty 9

## 2023-09-04 NOTE — ED Triage Notes (Signed)
 Pt sts that he has been having nasal congestion with a cluster headache since Dec 28th. Pt sts that he has been using an inhaler but it is not working.

## 2023-09-04 NOTE — ED Provider Triage Note (Signed)
 Emergency Medicine Provider Triage Evaluation Note  Jerimyah Vandunk III , a 43 y.o. male  was evaluated in triage.  Pt complains of shortness of breath, wheezing, congestion, cluster headache  Review of Systems  Positive: Wheezing Negative:   Physical Exam  BP (!) 140/98 (BP Location: Left Arm)   Pulse 62   Temp 98.3 F (36.8 C) (Oral)   Resp 18   Ht 5' 9 (1.753 m)   Wt 96.2 kg   SpO2 96%   BMI 31.31 kg/m  Gen:   Awake, no distress, no using accessory muscles Resp:  Normal effort expiratory wheezing MSK:   Moves extremities without difficulty  Other:    Medical Decision Making  Medically screening exam initiated at 7:15 PM.  Appropriate orders placed.  Norleen Rodgers Sacks III was informed that the remainder of the evaluation will be completed by another provider, this initial triage assessment does not replace that evaluation, and the importance of remaining in the ED until their evaluation is complete.  Patient with asthmatic crisis ordered a checks x-ray CBC, dexamethasone , respiratory treatment, Percocet   Aleisha Paone, PA-C 09/04/23 1916

## 2023-09-11 ENCOUNTER — Other Ambulatory Visit: Payer: Self-pay | Admitting: Family Medicine

## 2023-09-11 ENCOUNTER — Telehealth: Payer: Self-pay | Admitting: Family Medicine

## 2023-09-11 ENCOUNTER — Other Ambulatory Visit: Payer: Self-pay

## 2023-09-11 ENCOUNTER — Other Ambulatory Visit (HOSPITAL_COMMUNITY): Payer: Self-pay

## 2023-09-11 ENCOUNTER — Telehealth: Payer: Self-pay

## 2023-09-11 MED ORDER — ALBUTEROL SULFATE HFA 108 (90 BASE) MCG/ACT IN AERS
1.0000 | INHALATION_SPRAY | Freq: Four times a day (QID) | RESPIRATORY_TRACT | 2 refills | Status: DC | PRN
  Filled 2023-09-11: qty 6.7, 25d supply, fill #0
  Filled 2023-09-11: qty 18, 25d supply, fill #0

## 2023-09-11 NOTE — Telephone Encounter (Signed)
 William Kane P5 minutes ago (9:29 AM)   JS Medication Refill -  Most Recent Primary Care Visit:  Provider: Abraham Abo  Department: [facility]  Visit Type: NEW PATIENT  Date: 06/21/2022  Medication: oxyCODONE -acetaminophen  (PERCOCET) 5-325 MG tablet , albuterol  (VENTOLIN  HFA) 108 (90 Base) MCG/ACT inhaler    Has the patient contacted their pharmacy? Yes     Is this the correct pharmacy for this prescription? Yes If no, delete pharmacy and type the correct one.  This is the patient's preferred pharmacy:    [facility] Phone: [phone]  Fax: [phone]        Has the prescription been filled recently? No   Is the patient out of the medication? Yes   Has the patient been seen for an appointment in the last year OR does the patient have an upcoming appointment? Yes  Can we respond through MyChart? Yes but would prefer a call or email   Please disregard original request as he wants both of these medicines to go to the [facility] as soon as possible. Please assist him further

## 2023-09-11 NOTE — Telephone Encounter (Signed)
 Mr William Kane says he can't wait 48-72 hrs for his inhaler he needs it today as he used his last pump this morning

## 2023-09-11 NOTE — Telephone Encounter (Addendum)
 [facility] called and spoke to [facility] [employer] consultant about the refill(s) albuterol  requested. Advised it was sent to the [facility] and asked if she can pull it over, she says yes.She says a PA is required because of insurance and she will initiate it and send to the provider. She says patient will need to call the pharmacy to give the current insurance in case that is the problem. Patient called, no answer, recording call could not be completed at this time.

## 2023-09-11 NOTE — Telephone Encounter (Signed)
 Medication Refill -  Most Recent Primary Care Visit:  Provider: Abraham Abo  Department: [facility]  Visit Type: NEW PATIENT  Date: 06/21/2022  Medication: oxyCODONE -acetaminophen  (PERCOCET) 5-325 MG tablet , albuterol  (VENTOLIN  HFA) 108 (90 Base) MCG/ACT inhaler   Has the patient contacted their pharmacy? Yes   Is this the correct pharmacy for this prescription? Yes If no, delete pharmacy and type the correct one.  This is the patient's preferred pharmacy:   [facility] Phone: [phone]  Fax: [phone]      Has the prescription been filled recently? No  Is the patient out of the medication? Yes  Has the patient been seen for an appointment in the last year OR does the patient have an upcoming appointment? Yes  Can we respond through MyChart? Yes but would prefer a call or email  Please disregard original request as he wants both of these medicines to go to the [facility] as soon as possible. Please assist him further

## 2023-09-11 NOTE — Telephone Encounter (Signed)
 Medication Refill -  Most Recent Primary Care Visit:  Provider: Abraham Abo  Department: [facility]  Visit Type: NEW PATIENT  Date: 06/21/2022  Medication: oxyCODONE -acetaminophen  (PERCOCET) 5-325 MG tablet and albuterol  (VENTOLIN  HFA) 108 (90 Base) MCG/ACT inhaler  Has the patient contacted their pharmacy? No  Is this the correct pharmacy for this prescription? Yes  This is the patient's preferred pharmacy:  [facility] [address] [place] - [address] [place] [address] Phone: [phone] Fax: [phone]  [id number] [place], [place] - [address] [place] [address] Phone: [phone] Fax: [phone]  Has the prescription been filled recently? Yes  Is the patient out of the medication? Yes  Has the patient been seen for an appointment in the last year OR does the patient have an upcoming appointment? Yes  Can we respond through MyChart? No  Agent: Please be advised that Rx refills may take up to 3 business days. We ask that you follow-up with your pharmacy.

## 2023-09-11 NOTE — Telephone Encounter (Signed)
 Per Jolly Needle, Pharmacologist at [facility], a PA is needed for [facility] sent in today.

## 2023-09-11 NOTE — Telephone Encounter (Signed)
 Requested medication (s) are due for refill today: Yes  Requested medication (s) are on the active medication list: Yes  Last refill:  08/05/23  Future visit scheduled: Yes  Notes to clinic:  Unable to refill per protocol, cannot delegate.      Requested Prescriptions  Pending Prescriptions Disp Refills   oxyCODONE -acetaminophen  (PERCOCET) 5-325 MG tablet 12 tablet 0    Sig: Take 1 tablet by mouth every 8 (eight) hours as needed for severe pain (pain score 7-10).     Not Delegated - Analgesics:  Opioid Agonist Combinations Failed - 09/11/2023  9:50 AM      Failed - This refill cannot be delegated      Failed - Urine Drug Screen completed in last 360 days      Failed - Valid encounter within last 3 months    Recent Outpatient Visits           1 year ago Anxiety state   [facility], MD       Future Appointments             In 3 weeks Abraham Abo, MD [facility]

## 2023-09-11 NOTE — Telephone Encounter (Addendum)
 Pasted the below information into the earlier refill encounter from today.

## 2023-09-20 ENCOUNTER — Other Ambulatory Visit: Payer: Self-pay

## 2023-09-25 ENCOUNTER — Other Ambulatory Visit: Payer: Self-pay | Admitting: Family Medicine

## 2023-09-25 ENCOUNTER — Other Ambulatory Visit: Payer: Self-pay

## 2023-09-25 ENCOUNTER — Other Ambulatory Visit (HOSPITAL_COMMUNITY): Payer: Self-pay

## 2023-09-26 ENCOUNTER — Other Ambulatory Visit (HOSPITAL_COMMUNITY): Payer: Self-pay

## 2023-09-28 DIAGNOSIS — Z419 Encounter for procedure for purposes other than remedying health state, unspecified: Secondary | ICD-10-CM | POA: Diagnosis not present

## 2023-10-02 ENCOUNTER — Ambulatory Visit (INDEPENDENT_AMBULATORY_CARE_PROVIDER_SITE_OTHER): Payer: Medicaid Other | Admitting: Family Medicine

## 2023-10-02 ENCOUNTER — Encounter: Payer: Self-pay | Admitting: Family Medicine

## 2023-10-02 ENCOUNTER — Other Ambulatory Visit: Payer: Self-pay

## 2023-10-02 VITALS — BP 127/84 | HR 96 | Temp 98.5°F | Resp 18 | Ht 70.0 in | Wt 218.8 lb

## 2023-10-02 DIAGNOSIS — N529 Male erectile dysfunction, unspecified: Secondary | ICD-10-CM

## 2023-10-02 DIAGNOSIS — J4530 Mild persistent asthma, uncomplicated: Secondary | ICD-10-CM | POA: Diagnosis not present

## 2023-10-02 DIAGNOSIS — R609 Edema, unspecified: Secondary | ICD-10-CM

## 2023-10-02 DIAGNOSIS — R519 Headache, unspecified: Secondary | ICD-10-CM

## 2023-10-02 DIAGNOSIS — M1A9XX0 Chronic gout, unspecified, without tophus (tophi): Secondary | ICD-10-CM

## 2023-10-02 DIAGNOSIS — I10 Essential (primary) hypertension: Secondary | ICD-10-CM | POA: Diagnosis not present

## 2023-10-02 MED ORDER — COLCHICINE 0.6 MG PO TABS
0.6000 mg | ORAL_TABLET | Freq: Every day | ORAL | 0 refills | Status: DC
  Filled 2023-10-02: qty 30, 30d supply, fill #0

## 2023-10-02 MED ORDER — ALBUTEROL SULFATE HFA 108 (90 BASE) MCG/ACT IN AERS
1.0000 | INHALATION_SPRAY | Freq: Four times a day (QID) | RESPIRATORY_TRACT | 2 refills | Status: DC | PRN
  Filled 2023-10-02: qty 18, 25d supply, fill #0
  Filled 2023-11-01: qty 18, 25d supply, fill #1
  Filled 2023-11-25 (×2): qty 18, 25d supply, fill #2

## 2023-10-02 MED ORDER — SILDENAFIL CITRATE 100 MG PO TABS
50.0000 mg | ORAL_TABLET | Freq: Every day | ORAL | 11 refills | Status: DC | PRN
  Filled 2023-10-02: qty 5, 5d supply, fill #0
  Filled 2023-10-16 (×2): qty 5, 5d supply, fill #1
  Filled 2023-11-01: qty 5, 5d supply, fill #2
  Filled 2023-11-18: qty 5, 5d supply, fill #3
  Filled 2023-11-25 (×2): qty 5, 5d supply, fill #4
  Filled 2023-12-19: qty 5, 5d supply, fill #5
  Filled 2023-12-27: qty 5, 5d supply, fill #6
  Filled 2024-01-29: qty 5, 5d supply, fill #7
  Filled 2024-02-04: qty 5, 5d supply, fill #8
  Filled 2024-02-12: qty 5, 5d supply, fill #9
  Filled 2024-02-26: qty 5, 5d supply, fill #10
  Filled 2024-03-20: qty 5, 5d supply, fill #11

## 2023-10-02 MED ORDER — MOMETASONE FURO-FORMOTEROL FUM 100-5 MCG/ACT IN AERO
2.0000 | INHALATION_SPRAY | Freq: Two times a day (BID) | RESPIRATORY_TRACT | 1 refills | Status: DC
Start: 2023-10-02 — End: 2023-11-25
  Filled 2023-10-02: qty 13, 30d supply, fill #0
  Filled 2023-11-01: qty 13, 30d supply, fill #1

## 2023-10-03 ENCOUNTER — Other Ambulatory Visit: Payer: Self-pay

## 2023-10-03 ENCOUNTER — Other Ambulatory Visit (HOSPITAL_COMMUNITY): Payer: Self-pay

## 2023-10-04 ENCOUNTER — Encounter (HOSPITAL_BASED_OUTPATIENT_CLINIC_OR_DEPARTMENT_OTHER): Payer: Self-pay | Admitting: Emergency Medicine

## 2023-10-04 ENCOUNTER — Other Ambulatory Visit: Payer: Self-pay

## 2023-10-04 ENCOUNTER — Other Ambulatory Visit (HOSPITAL_BASED_OUTPATIENT_CLINIC_OR_DEPARTMENT_OTHER): Payer: Self-pay

## 2023-10-04 ENCOUNTER — Emergency Department (HOSPITAL_BASED_OUTPATIENT_CLINIC_OR_DEPARTMENT_OTHER)
Admission: EM | Admit: 2023-10-04 | Discharge: 2023-10-04 | Disposition: A | Discharge status: Home / Self Care | Payer: Medicaid Other | Attending: Emergency Medicine | Admitting: Emergency Medicine

## 2023-10-04 DIAGNOSIS — Z79899 Other long term (current) drug therapy: Secondary | ICD-10-CM | POA: Insufficient documentation

## 2023-10-04 DIAGNOSIS — J029 Acute pharyngitis, unspecified: Secondary | ICD-10-CM | POA: Insufficient documentation

## 2023-10-04 DIAGNOSIS — Z20822 Contact with and (suspected) exposure to covid-19: Secondary | ICD-10-CM | POA: Insufficient documentation

## 2023-10-04 DIAGNOSIS — R519 Headache, unspecified: Secondary | ICD-10-CM | POA: Insufficient documentation

## 2023-10-04 LAB — GROUP A STREP BY PCR: Group A Strep by PCR: NOT DETECTED

## 2023-10-04 LAB — RESP PANEL BY RT-PCR (RSV, FLU A&B, COVID)  RVPGX2
Influenza A by PCR: NEGATIVE
Influenza B by PCR: NEGATIVE
Resp Syncytial Virus by PCR: NEGATIVE
SARS Coronavirus 2 by RT PCR: NEGATIVE

## 2023-10-04 MED ORDER — ACETAMINOPHEN 500 MG PO TABS
1000.0000 mg | ORAL_TABLET | Freq: Once | ORAL | Status: AC
  Administered 2023-10-04: 1000 mg via ORAL
  Filled 2023-10-04: qty 2

## 2023-10-04 MED ORDER — IBUPROFEN 400 MG PO TABS
400.0000 mg | ORAL_TABLET | Freq: Once | ORAL | Status: AC
  Administered 2023-10-04: 400 mg via ORAL
  Filled 2023-10-04: qty 1

## 2023-10-04 MED ORDER — TRAMADOL HCL 50 MG PO TABS
50.0000 mg | ORAL_TABLET | Freq: Once | ORAL | Status: AC
  Administered 2023-10-04: 50 mg via ORAL
  Filled 2023-10-04: qty 1

## 2023-10-04 NOTE — ED Triage Notes (Signed)
 C/o sore throat and headache since yesterday. Hx of cluster headaches. Also endorses lower back pain x 1 weeks. Denies urinary symptoms.

## 2023-10-04 NOTE — Discharge Instructions (Addendum)
 It was our pleasure to provide your ER care today - we hope that you feel better.  Drink plenty of fluids/stay well hydrated.  Take acetaminophen  or ibuprofen  or excedrin as need.   Follow up closely  with primary care doctor in next one -  two weeks if symptoms fail to improve/resolve.  Return to ER if worse, new symptoms, high fevers, unable to swallow,  increased trouble breathing, severe headache, worsening throat pain/swelling, or other emergency concern.    You were given pain meds in the ER - no driving for the next 6 hours.

## 2023-10-04 NOTE — ED Provider Notes (Addendum)
 [place] EMERGENCY DEPARTMENT AT [facility] Provider Note   CSN: [id number] Arrival date & time: 10/04/23  9096     History  Chief Complaint  Patient presents with   Sore Throat    William Kane is a 43 y.o. male.  Pt c/o sore throat, body aches, frontal headache, low back pain, in past few days. No trouble breathing or swallowing. No cough. No chest pain. No sinus drainage or sinus pain. Frontal headache, dull, mild, gradual onset - notes hx similar headaches in past. No neck pain or stiffness. No ear pain. No fever or chills. No specific known ill contacts. Low back pain, dull, mod, bilateral, non radiating. No midline neck or back pain. No associated numbness or weakness. No specific back injury or strain. No problems w normal bowel/bladder function. No abd pain or nvd. No dysuria or gu c/o.   The history is provided by the patient.  Sore Throat Associated symptoms include headaches. Pertinent negatives include no chest pain, no abdominal pain and no shortness of breath.       Home Medications Prior to Admission medications   Medication Sig Start Date End Date Taking? Authorizing Provider  acetaminophen  (TYLENOL ) 500 MG tablet Take 1,000 mg by mouth every 6 (six) hours as needed for mild pain or headache.    [provider]  albuterol  (ACCUNEB ) 1.25 MG/3ML nebulizer solution Take 1 ampule by nebulization every 6 (six) hours as needed for wheezing.    [provider]  albuterol  (VENTOLIN  HFA) 108 (90 Base) MCG/ACT inhaler Inhale 1-2 puffs into the lungs every 6 (six) hours as needed for wheezing or shortness of breath. 10/02/23   Tanda Bleacher, MD  colchicine  0.6 MG tablet Take 1 tablet (0.6 mg total) by mouth daily. 10/02/23   Tanda Bleacher, MD  dicyclomine  (BENTYL ) 20 MG tablet Take 1 tablet (20 mg total) by mouth 3 (three) times daily as needed for spasms. Patient not taking: Reported on 10/02/2023 07/25/23   Jhonny Calvin NOVAK, MD  meloxicam   (MOBIC ) 15 MG tablet Take 1 tablet (15 mg total) by mouth daily. 08/05/23 08/04/24  Arlander [place], MD  mometasone -formoterol  (DULERA ) 100-5 MCG/ACT AERO Inhale 2 puffs into the lungs 2 (two) times daily. 10/02/23   Tanda Bleacher, MD  ondansetron  (ZOFRAN ) 4 MG tablet Take 1 tablet (4 mg total) by mouth every 6 (six) hours as needed for nausea. Patient not taking: Reported on 10/02/2023 07/25/23   Jhonny Calvin NOVAK, MD  oxyCODONE -acetaminophen  (PERCOCET) 5-325 MG tablet Take 1 tablet by mouth every 8 (eight) hours as needed for severe pain (pain score 7-10). Patient not taking: Reported on 10/02/2023 08/05/23 08/04/24  Arlander [place], MD  sildenafil  (VIAGRA ) 100 MG tablet Take 0.5-1 tablets (50-100 mg total) by mouth daily as needed for erectile dysfunction. 10/02/23   Tanda Bleacher, MD      Allergies    Naproxen     Review of Systems   Review of Systems  Constitutional:  Negative for fever.  HENT:  Positive for sore throat. Negative for rhinorrhea and sinus pain.   Eyes:  Negative for pain, discharge, redness and visual disturbance.  Respiratory:  Negative for cough and shortness of breath.   Cardiovascular:  Negative for chest pain.  Gastrointestinal:  Negative for abdominal pain, diarrhea and vomiting.  Genitourinary:  Negative for dysuria and flank pain.  Musculoskeletal:  Positive for back pain. Negative for neck pain and neck stiffness.  Skin:  Negative for rash.  Neurological:  Positive for  headaches. Negative for weakness and numbness.    Physical Exam Updated Vital Signs BP (!) 126/91 (BP Location: Left Arm)   Pulse 98   Temp 98.3 F (36.8 C) (Oral)   Resp 18   Ht 1.778 m (5' 10)   Wt 99 kg   SpO2 98%   BMI 31.32 kg/m  Physical Exam Vitals and nursing note reviewed.  Constitutional:      Appearance: Normal appearance. He is well-developed.  HENT:     Head: Atraumatic.     Nose: Nose normal.     Mouth/Throat:     Mouth: Mucous membranes are moist.     Pharynx: Oropharynx  is clear. Posterior oropharyngeal erythema present. No oropharyngeal exudate.     Comments: No asymmetric swelling or abscess. No trismus.  Eyes:     General: No scleral icterus.    Conjunctiva/sclera: Conjunctivae normal.     Pupils: Pupils are equal, round, and reactive to light.  Neck:     Trachea: No tracheal deviation.     Comments: Normal rom comfortably, without pain. No neck stiffness or rigidity. Trachea midline, thyroid not grossly enlarged or tender. No l/a or mass.  Cardiovascular:     Rate and Rhythm: Normal rate and regular rhythm.     Pulses: Normal pulses.     Heart sounds: Normal heart sounds. No murmur heard.    No friction rub. No gallop.  Pulmonary:     Effort: Pulmonary effort is normal. No accessory muscle usage or respiratory distress.     Breath sounds: Normal breath sounds.  Abdominal:     General: Bowel sounds are normal. There is no distension.     Palpations: Abdomen is soft.     Tenderness: There is no abdominal tenderness.     Comments: No hsm.   Genitourinary:    Comments: No cva tenderness. Musculoskeletal:        General: No swelling or tenderness.     Cervical back: Normal range of motion and neck supple. No rigidity or tenderness.     Right lower leg: No edema.     Left lower leg: No edema.     Comments: Mild lumbar muscular tenderness. T/L/S spine non tender, aligned. No back sts, no redness, no skin lesions.   Lymphadenopathy:     Cervical: No cervical adenopathy.  Skin:    General: Skin is warm and dry.     Findings: No rash.  Neurological:     Mental Status: He is alert.     Comments: Alert, speech clear. Motor/sens grossly intact bil. Steady gait.   Psychiatric:        Mood and Affect: Mood normal.     ED Results / Procedures / Treatments   Labs (all labs ordered are listed, but only abnormal results are displayed) Results for orders placed or performed during the hospital encounter of 10/04/23  Group A Strep by PCR   Collection  Time: 10/04/23  9:29 AM   Specimen: Throat; Sterile Swab  Result Value Ref Range   Group A Strep by PCR NOT DETECTED NOT DETECTED  Resp panel by RT-PCR (RSV, Flu A&B, Covid) Throat   Collection Time: 10/04/23  9:29 AM   Specimen: Throat; Nasal Swab  Result Value Ref Range   SARS Coronavirus 2 by RT PCR NEGATIVE NEGATIVE   Influenza A by PCR NEGATIVE NEGATIVE   Influenza B by PCR NEGATIVE NEGATIVE   Resp Syncytial Virus by PCR NEGATIVE NEGATIVE   DG Chest  2 View Result Date: 09/04/2023 CLINICAL DATA:  Cough, shortness of breath, wheezing, congestion EXAM: CHEST - 2 VIEW COMPARISON:  01/04/2022 FINDINGS: The heart size and mediastinal contours are within normal limits. Both lungs are clear. The visualized skeletal structures are unremarkable. IMPRESSION: No active cardiopulmonary disease. Electronically Signed   By: Franky Crease M.D.   On: 09/04/2023 20:35    EKG None  Radiology No results found.  Procedures Procedures    Medications Ordered in ED Medications  acetaminophen  (TYLENOL ) tablet 1,000 mg (1,000 mg Oral Given 10/04/23 0930)  ibuprofen  (ADVIL ) tablet 400 mg (400 mg Oral Given 10/04/23 9070)    ED Course/ Medical Decision Making/ A&P                                 Medical Decision Making Problems Addressed: Frontal headache: acute illness or injury Sore throat: acute illness or injury  Amount and/or Complexity of Data Reviewed External Data Reviewed: notes. Labs: ordered. Decision-making details documented in ED Course.  Risk OTC drugs. Prescription drug management.   Labs sent.   Reviewed nursing notes and prior charts for additional history.   No meds pta.   Acetaminophen  po, ibuprofen  po.   Po fluids.   Labs reviewed/interpreted by me - flu neg. Strep neg.   Pt requests pain medication. Indicates has ride, did not drive. Ultram  po.   Po fluids/food. No emesis.   Pts symptoms/exam felt most c/w viral process.   Rec pcp f/u.   Return  precautions provided.            Final Clinical Impression(s) / ED Diagnoses Final diagnoses:  None    Rx / DC Orders ED Discharge Orders     None            Bernard Franky, MD 10/04/23 1027

## 2023-10-04 NOTE — ED Notes (Signed)
 Patient offered fluids and encouraged to eat.

## 2023-10-04 NOTE — ED Notes (Signed)
 Pt alert and oriented X 4 at the time of discharge. RR even and unlabored. No acute distress noted. Pt verbalized understanding of discharge instructions as discussed. Pt ambulatory to lobby at time of discharge.

## 2023-10-07 ENCOUNTER — Encounter: Payer: Self-pay | Admitting: Family Medicine
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
## 2023-10-16 ENCOUNTER — Other Ambulatory Visit (HOSPITAL_COMMUNITY): Payer: Self-pay

## 2023-10-16 ENCOUNTER — Other Ambulatory Visit: Payer: Self-pay

## 2023-10-26 DIAGNOSIS — Z419 Encounter for procedure for purposes other than remedying health state, unspecified: Secondary | ICD-10-CM | POA: Diagnosis not present

## 2023-10-31 ENCOUNTER — Ambulatory Visit: Payer: Medicaid Other | Admitting: Family Medicine

## 2023-11-01 ENCOUNTER — Other Ambulatory Visit: Payer: Self-pay | Admitting: Family Medicine

## 2023-11-01 ENCOUNTER — Other Ambulatory Visit: Payer: Self-pay

## 2023-11-01 MED ORDER — COLCHICINE 0.6 MG PO TABS
0.6000 mg | ORAL_TABLET | Freq: Every day | ORAL | 0 refills | Status: DC
  Filled 2023-11-01: qty 30, 30d supply, fill #0

## 2023-11-04 ENCOUNTER — Other Ambulatory Visit: Payer: Self-pay

## 2023-11-17 ENCOUNTER — Emergency Department (HOSPITAL_BASED_OUTPATIENT_CLINIC_OR_DEPARTMENT_OTHER)
Admission: EM | Admit: 2023-11-17 | Discharge: 2023-11-17 | Disposition: A | Discharge status: Home / Self Care | Attending: Emergency Medicine | Admitting: Emergency Medicine

## 2023-11-17 ENCOUNTER — Emergency Department (HOSPITAL_BASED_OUTPATIENT_CLINIC_OR_DEPARTMENT_OTHER)

## 2023-11-17 ENCOUNTER — Emergency Department (HOSPITAL_BASED_OUTPATIENT_CLINIC_OR_DEPARTMENT_OTHER): Admitting: Radiology

## 2023-11-17 DIAGNOSIS — M5442 Lumbago with sciatica, left side: Secondary | ICD-10-CM | POA: Insufficient documentation

## 2023-11-17 DIAGNOSIS — M25511 Pain in right shoulder: Secondary | ICD-10-CM | POA: Insufficient documentation

## 2023-11-17 DIAGNOSIS — R739 Hyperglycemia, unspecified: Secondary | ICD-10-CM | POA: Diagnosis not present

## 2023-11-17 DIAGNOSIS — M5127 Other intervertebral disc displacement, lumbosacral region: Secondary | ICD-10-CM | POA: Diagnosis not present

## 2023-11-17 DIAGNOSIS — M25521 Pain in right elbow: Secondary | ICD-10-CM | POA: Insufficient documentation

## 2023-11-17 DIAGNOSIS — J45909 Unspecified asthma, uncomplicated: Secondary | ICD-10-CM | POA: Insufficient documentation

## 2023-11-17 DIAGNOSIS — M47816 Spondylosis without myelopathy or radiculopathy, lumbar region: Secondary | ICD-10-CM | POA: Diagnosis not present

## 2023-11-17 DIAGNOSIS — E876 Hypokalemia: Secondary | ICD-10-CM | POA: Diagnosis not present

## 2023-11-17 DIAGNOSIS — I700 Atherosclerosis of aorta: Secondary | ICD-10-CM | POA: Diagnosis not present

## 2023-11-17 DIAGNOSIS — M79662 Pain in left lower leg: Secondary | ICD-10-CM

## 2023-11-17 DIAGNOSIS — M79605 Pain in left leg: Secondary | ICD-10-CM | POA: Diagnosis not present

## 2023-11-17 LAB — COMPREHENSIVE METABOLIC PANEL
ALT: 40 U/L (ref 0–44)
AST: 30 U/L (ref 15–41)
Albumin: 4.3 g/dL (ref 3.5–5.0)
Alkaline Phosphatase: 38 U/L (ref 38–126)
Anion gap: 14 (ref 5–15)
BUN: 10 mg/dL (ref 6–20)
CO2: 22 mmol/L (ref 22–32)
Calcium: 8.8 mg/dL — ABNORMAL LOW (ref 8.9–10.3)
Chloride: 104 mmol/L (ref 98–111)
Creatinine, Ser: 0.89 mg/dL (ref 0.61–1.24)
GFR, Estimated: >60 mL/min (ref >60)
Glucose, Bld: 85 mg/dL (ref 70–99)
Potassium: 3.4 mmol/L — ABNORMAL LOW (ref 3.5–5.1)
Sodium: 140 mmol/L (ref 135–145)
Total Bilirubin: 0.5 mg/dL (ref 0.0–1.2)
Total Protein: 7.2 g/dL (ref 6.5–8.1)

## 2023-11-17 LAB — CBC
HCT: 40.5 % (ref 39.0–52.0)
Hemoglobin: 14.1 g/dL (ref 13.0–17.0)
MCH: 32.9 pg (ref 26.0–34.0)
MCHC: 34.8 g/dL (ref 30.0–36.0)
MCV: 94.6 fL (ref 80.0–100.0)
Platelets: 251 10*3/uL (ref 150–400)
RBC: 4.28 MIL/uL (ref 4.22–5.81)
RDW: 13.9 % (ref 11.5–15.5)
WBC: 6.5 10*3/uL (ref 4.0–10.5)
nRBC: 0 % (ref 0.0–0.2)

## 2023-11-17 MED ORDER — HYDROCODONE-ACETAMINOPHEN 5-325 MG PO TABS
1.0000 | ORAL_TABLET | Freq: Once | ORAL | Status: AC
  Administered 2023-11-17: 1 via ORAL
  Filled 2023-11-17: qty 1

## 2023-11-17 MED ORDER — METHYLPREDNISOLONE 4 MG PO TBPK: ORAL_TABLET | ORAL | 0 refills | Status: DC

## 2023-11-17 MED ORDER — METHYLPREDNISOLONE 4 MG PO TBPK
ORAL_TABLET | ORAL | 0 refills | Status: DC
Start: 2023-11-17 — End: 2023-11-17

## 2023-11-17 MED ORDER — CYCLOBENZAPRINE HCL 10 MG PO TABS: 10.0000 mg | ORAL_TABLET | Freq: Two times a day (BID) | ORAL | 0 refills | Status: DC | PRN

## 2023-11-17 MED ORDER — CYCLOBENZAPRINE HCL 10 MG PO TABS: 10.0000 mg | ORAL_TABLET | Freq: Two times a day (BID) | ORAL | 0 refills | Status: AC | PRN

## 2023-11-17 MED ORDER — KETOROLAC TROMETHAMINE 30 MG/ML IJ SOLN
30.0000 mg | Freq: Once | INTRAMUSCULAR | Status: AC
Start: 2023-11-17 — End: 2023-11-17
  Administered 2023-11-17: 30 mg via INTRAMUSCULAR
  Filled 2023-11-17: qty 1

## 2023-11-17 MED ORDER — OXYCODONE-ACETAMINOPHEN 5-325 MG PO TABS
1.0000 | ORAL_TABLET | Freq: Once | ORAL | Status: AC
  Administered 2023-11-17: 1 via ORAL
  Filled 2023-11-17: qty 1
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
## 2023-11-17 NOTE — ED Notes (Addendum)
 Discharge paperwork given and verbally understood... Pt aware of no drinking/driving due to the meds that were given.Marland KitchenMarland Kitchen
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
## 2023-11-17 NOTE — Discharge Instructions (Addendum)
 As discussed, CT scan of your back showed some bulging on the left side of a disc in your low back which could be causing your symptoms.  Will recommend follow-up with neurosurgery in the outpatient setting given your continued back pain.  There was 1 area on your left tibia of lucency which is of unknown etiology right now.  Will recommend follow-up with primary care for further assessment regarding this with potential bone scan.  X-ray of your right elbow showed evidence of bursitis.  X-ray of your right shoulder did not show evidence of fracture or dislocation but there is concern that you may have a labral tear or a rotator cuff tear.  Will attach information for orthopedics to follow-up with.  Will send you with a Medrol Dosepak to help treat your symptoms as well as muscle relaxer.  Muscle relaxer can cause drowsiness so please do not drive or perform any high risk activity until you realize its effects on you.  Please do not hesitate to return if the worrisome signs and symptoms we discussed become apparent.

## 2023-11-18 ENCOUNTER — Other Ambulatory Visit: Payer: Self-pay

## 2023-11-21 ENCOUNTER — Ambulatory Visit (INDEPENDENT_AMBULATORY_CARE_PROVIDER_SITE_OTHER): Admitting: Family Medicine

## 2023-11-21 ENCOUNTER — Encounter: Payer: Self-pay | Admitting: Family Medicine

## 2023-11-21 VITALS — BP 132/84 | HR 105 | Temp 98.4°F | Resp 18 | Ht 70.5 in | Wt 219.6 lb

## 2023-11-21 DIAGNOSIS — M899 Disorder of bone, unspecified: Secondary | ICD-10-CM

## 2023-11-21 DIAGNOSIS — M79605 Pain in left leg: Secondary | ICD-10-CM | POA: Diagnosis not present
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
## 2023-11-25 ENCOUNTER — Other Ambulatory Visit: Payer: Self-pay | Admitting: Family Medicine

## 2023-11-25 ENCOUNTER — Other Ambulatory Visit: Payer: Self-pay

## 2023-11-25 MED ORDER — COLCHICINE 0.6 MG PO TABS
0.6000 mg | ORAL_TABLET | Freq: Every day | ORAL | 0 refills | Status: DC
  Filled 2023-11-25: qty 30, 30d supply, fill #0

## 2023-11-25 MED ORDER — DULERA 100-5 MCG/ACT IN AERO
2.0000 | INHALATION_SPRAY | Freq: Two times a day (BID) | RESPIRATORY_TRACT | 1 refills | Status: AC
  Filled 2023-11-25: qty 13, 30d supply, fill #0
  Filled 2024-05-01: qty 13, 30d supply, fill #1

## 2023-11-26 ENCOUNTER — Other Ambulatory Visit: Payer: Self-pay

## 2023-12-07 DIAGNOSIS — Z419 Encounter for procedure for purposes other than remedying health state, unspecified: Secondary | ICD-10-CM | POA: Diagnosis not present

## 2023-12-19 ENCOUNTER — Emergency Department (HOSPITAL_BASED_OUTPATIENT_CLINIC_OR_DEPARTMENT_OTHER)
Admission: EM | Admit: 2023-12-19 | Discharge: 2023-12-19 | Disposition: A | Discharge status: Home / Self Care | Attending: Emergency Medicine | Admitting: Emergency Medicine

## 2023-12-19 ENCOUNTER — Encounter (HOSPITAL_BASED_OUTPATIENT_CLINIC_OR_DEPARTMENT_OTHER): Payer: Self-pay | Admitting: Emergency Medicine

## 2023-12-19 ENCOUNTER — Other Ambulatory Visit: Payer: Self-pay | Admitting: Family Medicine

## 2023-12-19 ENCOUNTER — Other Ambulatory Visit (HOSPITAL_COMMUNITY): Payer: Self-pay

## 2023-12-19 ENCOUNTER — Other Ambulatory Visit: Payer: Self-pay

## 2023-12-19 ENCOUNTER — Telehealth: Payer: Self-pay

## 2023-12-19 DIAGNOSIS — R2242 Localized swelling, mass and lump, left lower limb: Secondary | ICD-10-CM | POA: Diagnosis not present

## 2023-12-19 DIAGNOSIS — M7989 Other specified soft tissue disorders: Secondary | ICD-10-CM | POA: Diagnosis not present

## 2023-12-19 DIAGNOSIS — M79605 Pain in left leg: Secondary | ICD-10-CM | POA: Diagnosis present

## 2023-12-19 MED ORDER — MORPHINE SULFATE (PF) 4 MG/ML IV SOLN
4.0000 mg | Freq: Once | INTRAVENOUS | Status: AC
  Administered 2023-12-19: 4 mg via INTRAMUSCULAR
  Filled 2023-12-19: qty 1

## 2023-12-19 MED ORDER — OXYCODONE HCL 5 MG PO TABS
5.0000 mg | ORAL_TABLET | Freq: Four times a day (QID) | ORAL | 0 refills | Status: DC | PRN
  Filled 2023-12-19: qty 20, 5d supply, fill #0

## 2023-12-19 NOTE — ED Triage Notes (Signed)
 Left leg pain Pain on lower leg  Was told he has a cancerous spot on leg in post but hasn't been able to follow up

## 2023-12-19 NOTE — Telephone Encounter (Signed)
 Copied from CRM [id number]. Topic: General - Other >> Dec 19, 2023  9:37 AM Marissa P wrote: Reason for CRM: Patient called in for an update on a referral that he requested from his last appointment. They were telling him it is closed and needs to be reopened please Callback [phone]

## 2023-12-19 NOTE — ED Provider Notes (Signed)
 [place] EMERGENCY DEPARTMENT AT [facility] Provider Note   CSN: [id number] Arrival date & time: 12/19/23  1438     History Chief Complaint  Patient presents with   Leg Pain    HPI William Kane is a 43 y.o. male presenting for 2 months of left shin pain.  Seen recently.  Had an x-ray that showed a bony lesion.  Was referred to primary care who ordered an outpatient nuclear medicine study.  Unfortunately had a death in the family and missed the outpatient nuclear medicine scan as well as an orthopedic referral. He tried to get these appointments rescheduled today but he states that they were unable to get him rescheduled yet and he needed to talk back with his primary doctor. His pain is severe.  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 He does deny fevers chills nausea vomiting syncope shortness of breath.  Patient's recorded medical, surgical, social, medication list and allergies were reviewed in the Snapshot window as part of the initial history.   Review of Systems   Review of Systems  Constitutional:  Negative for chills and fever.  HENT:  Negative for ear pain and sore throat.   Eyes:  Negative for pain and visual disturbance.  Respiratory:  Negative for cough and shortness of breath.   Cardiovascular:  Negative for chest pain and palpitations.  Gastrointestinal:  Negative for abdominal pain and vomiting.  Genitourinary:  Negative for dysuria and hematuria.  Musculoskeletal:  Negative for arthralgias and back pain.  Skin:  Negative for color change and rash.  Neurological:  Negative for seizures and syncope.  All other systems reviewed and are negative.   Physical Exam Updated Vital Signs BP (!) 144/103 (BP Location: Right Arm)   Pulse (!) 108   Temp 98.3 F (36.8 C)   Resp 17   SpO2 98%  Physical Exam Vitals and nursing note reviewed.  Constitutional:      General: He is not in acute distress.     Appearance: He is well-developed.  HENT:     Head: Normocephalic and atraumatic.  Eyes:     Conjunctiva/sclera: Conjunctivae normal.  Cardiovascular:     Rate and Rhythm: Normal rate and regular rhythm.     Heart sounds: No murmur heard. Pulmonary:     Effort: Pulmonary effort is normal. No respiratory distress.     Breath sounds: Normal breath sounds.  Abdominal:     Palpations: Abdomen is soft.     Tenderness: There is no abdominal tenderness.  Musculoskeletal:        General: Swelling (Palpable tender swelling in his left anterior medial shin.) present.     Cervical back: Neck supple.  Skin:    General: Skin is warm and dry.     Capillary Refill: Capillary refill takes less than 2 seconds.  Neurological:     Mental Status: He is alert.  Psychiatric:        Mood and Affect: Mood normal.      ED Course/ Medical Decision Making/ A&P    Procedures Procedures   Medications Ordered in ED Medications  morphine  (PF) 4 MG/ML injection 4 mg (has no administration in time range)    Medical Decision Making:   William Kane is a 43 y.o. male who presented to the ED today with left shin pain detailed above.    Additional history discussed with patient's family/caregivers.  Complete initial  physical exam performed, notably the patient  was hemodynamically stable no acute distress. He does have a palpable tender nodule on his left anterior shin..    Reviewed and confirmed nursing documentation for past medical history, family history, social history.   Chart review from his external care plan shows plan for outpatient studies. He has called his PCP is waiting on a call back for planning scheduled. Will be refer him to orthopedics, prescribe him pain control as needed. Strict return precautions reinforced.  Disposition:  I have considered need for hospitalization, however, considering all of the above, I believe this patient is stable for discharge at this  time.  Patient/family educated about specific return precautions for given chief complaint and symptoms.  Patient/family educated about follow-up with PCP and orthopedics.     Patient/family expressed understanding of return precautions and need for follow-up. Patient spoken to regarding all imaging and laboratory results and appropriate follow up for these results. All education provided in verbal form with additional information in written form. Time was allowed for answering of patient questions. Patient discharged.    Emergency Department Medication Summary:   Medications  morphine  (PF) 4 MG/ML injection 4 mg (has no administration in time range)        Clinical Impression:  1. Subcutaneous nodule of left lower leg      Discharge   Final Clinical Impression(s) / ED Diagnoses Final diagnoses:  Subcutaneous nodule of left lower leg    Rx / DC Orders ED Discharge Orders          Ordered    AMB referral to orthopedics        12/19/23 1711    oxyCODONE  (ROXICODONE ) 5 MG immediate release tablet  Every 6 hours PRN        12/19/23 1713              Onetha Bile, MD 12/19/23 1713

## 2023-12-19 NOTE — Discharge Instructions (Signed)
 You were seen today for leg pain. You do have a tender nodule at this site.  This is likely a bony lesion that should be seen by orthopedics for further care and management. You need to call your primary care doctor to get the bone scan they recommended rescheduled.  I have sent pain medication to the pharmacy as a bridge for you to reconnect with your primary care doctor.  You were seen today for leg pain. We did not identify any emergent cause for your symptoms. Your evaluation is most consistent with nonspecific nodule.   Plan and next steps:   The following may be helpful in managing your symptoms:   Pain- Lidocaine  Patches  Apply to affected area for up to 12 hours at a time.   Pain/Fever- Adult Tylenol  dosing:  650 mg orally every 4 to 6 hours as needed, MAX: 3250 mg/24 hours   (Extra-strength) 1000 mg orally every 6 hours as needed; MAX: 3000 mg/24 hours   Do not use if you have liver disease. Read the label on the bottle.   Pain/Fever- Adult Ibuprofen  Dosing  200 to 400 mg orally every 4 to 6 hours as needed; MAX 1200 mg/day; do not take longer than 10 days   Do not use if you have kidney disease. Read the label on the bottle    Findings:  You may see all of your lab and imaging results utilizing our online portal! Look in this document or ask a team member for your mychart* access information. The most notable results have additionally been verbally communicated with you and your bedside family.    Follow-up Plan:   Follow up with the patient's normal primary care provider for monitoring of this condition within 48 hours.   Signs/Symptoms that would warrant return to the ED:  Please return to the ED if you experience worsening of symptoms or any abrupt changes in your health. Standard of care precautions for your chief complaint have already been verbally communicated with you. Always be on alert for fevers, chills, shortness of breath, chest pains, or sudden changes that  warrant immediate evaluation.    Thank you for allowing us  to be a part of you and your families' care.   Onetha Bile MD

## 2023-12-20 ENCOUNTER — Telehealth: Payer: Self-pay

## 2023-12-20 ENCOUNTER — Other Ambulatory Visit: Payer: Self-pay

## 2023-12-20 MED ORDER — ALBUTEROL SULFATE HFA 108 (90 BASE) MCG/ACT IN AERS
1.0000 | INHALATION_SPRAY | Freq: Four times a day (QID) | RESPIRATORY_TRACT | 1 refills | Status: DC | PRN
Start: 2023-12-20 — End: 2024-05-27
  Filled 2023-12-20: qty 54, 75d supply, fill #0
  Filled 2024-01-29 – 2024-02-26 (×2): qty 54, 75d supply, fill #1

## 2023-12-20 NOTE — Telephone Encounter (Signed)
 Pt called and lm on vm to advise that he needs appt with ortho. He had xray in March that did show an area of concern for "possible fibrous dysplasia, metastasis, multiple myeloma less likely infection" [phone] I see that there is a referral in the chart and did not want to mess up anything that you could be working on. Dr. Julio Ohm would be happy to see him. I think he has a NP opening on Monday at 10:30 let me know what you would like me to do.

## 2023-12-20 NOTE — Telephone Encounter (Signed)
 Requested Prescriptions  Pending Prescriptions Disp Refills   albuterol  (VENTOLIN  HFA) 108 (90 Base) MCG/ACT inhaler 3 each 1    Sig: Inhale 1-2 puffs into the lungs every 6 (six) hours as needed for wheezing or shortness of breath.     Pulmonology:  Beta Agonists 2 Failed - 12/20/2023  1:06 PM      Failed - Last BP in normal range    BP Readings from Last 1 Encounters:  12/19/23 (!) 140/95         Passed - Last Heart Rate in normal range    Pulse Readings from Last 1 Encounters:  12/19/23 100         Passed - Valid encounter within last 12 months    Recent Outpatient Visits           4 weeks ago Pain of left lower extremity   [facility], Ray Caffey, MD   2 months ago Nonintractable headache, unspecified chronicity pattern, unspecified headache type   [facility], MD   1 year ago Anxiety state   [facility], MD

## 2023-12-23 ENCOUNTER — Encounter: Payer: Self-pay | Admitting: Oncology

## 2023-12-23 ENCOUNTER — Other Ambulatory Visit: Payer: Self-pay

## 2023-12-23 ENCOUNTER — Ambulatory Visit: Admitting: Orthopedic Surgery

## 2023-12-23 ENCOUNTER — Encounter: Payer: Self-pay | Admitting: Orthopedic Surgery

## 2023-12-23 DIAGNOSIS — M898X9 Other specified disorders of bone, unspecified site: Secondary | ICD-10-CM | POA: Diagnosis not present

## 2023-12-23 MED ORDER — OXYCODONE-ACETAMINOPHEN 5-325 MG PO TABS
1.0000 | ORAL_TABLET | Freq: Three times a day (TID) | ORAL | 0 refills | Status: DC | PRN
  Filled 2023-12-23: qty 15, 5d supply, fill #0
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
## 2023-12-23 NOTE — Telephone Encounter (Signed)
 Noted thank you

## 2023-12-23 NOTE — Progress Notes (Signed)
 Referral Review:   Patient referred by Dr. Julio Ohm.  Brief HPI:  Eval'd in ED on 11/17/2023 for extremity pain - one being his left lower extremity.  DG Tib/Fib Left performed with lesion noted.  Asked to follow-up with his PCP and Ortho.  Missed his ortho appontment, which he subsequently had today and was directly referred to oncology without additional work-up as of right now.   Of note on Dr. Aniceto Kern incomplete office note from today, mention is made two surgeries but unable to locate a cancer history for them:  colectomy, orchiectomy.  Patient is highly anxious, saying he is very familiar with the cancer center, as cancer has taken both his parents and other family members.   Addt'l Referrals Placed:  No Addt'l Imaging or Procedures Placed:  No   Imaging Supporting Referral:   11/17/2023 DG TIB/FIB LEFT IMPRESSION: 1. No acute fracture or dislocation. 2. Well-circumscribed 10 x 8 mm lucent focus centered within the posteromedial distal tibial cortex with narrow zone of transition. Differential includes fibrous dysplasia, metastasis, multiple myeloma, less likely infection. Consider nuclear medicine bone scan for further evaluation.   Age Related Cancer Screenings:   Colonoscopy or Cologuard (M/F > 45):  07/23/2023 PSA (M > 45):  Not found in EHR
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
## 2023-12-25 ENCOUNTER — Inpatient Hospital Stay: Attending: Physician Assistant | Admitting: Physician Assistant

## 2023-12-25 ENCOUNTER — Inpatient Hospital Stay

## 2023-12-25 ENCOUNTER — Other Ambulatory Visit: Payer: Self-pay

## 2023-12-25 VITALS — BP 139/96 | HR 102 | Temp 97.5°F | Resp 18 | Ht 70.5 in | Wt 221.8 lb

## 2023-12-25 DIAGNOSIS — Z79899 Other long term (current) drug therapy: Secondary | ICD-10-CM | POA: Diagnosis not present

## 2023-12-25 DIAGNOSIS — M898X9 Other specified disorders of bone, unspecified site: Secondary | ICD-10-CM

## 2023-12-25 LAB — CMP (CANCER CENTER ONLY)
ALT: 98 U/L — ABNORMAL HIGH (ref 0–44)
AST: 48 U/L — ABNORMAL HIGH (ref 15–41)
Albumin: 4.4 g/dL (ref 3.5–5.0)
Alkaline Phosphatase: 44 U/L (ref 38–126)
Anion gap: 9 (ref 5–15)
BUN: 10 mg/dL (ref 6–20)
CO2: 25 mmol/L (ref 22–32)
Calcium: 9.2 mg/dL (ref 8.9–10.3)
Chloride: 104 mmol/L (ref 98–111)
Creatinine: 0.81 mg/dL (ref 0.61–1.24)
GFR, Estimated: >60 mL/min (ref >60)
Glucose, Bld: 69 mg/dL — ABNORMAL LOW (ref 70–99)
Potassium: 3.9 mmol/L (ref 3.5–5.1)
Sodium: 138 mmol/L (ref 135–145)
Total Bilirubin: 0.3 mg/dL (ref 0.0–1.2)
Total Protein: 7.5 g/dL (ref 6.5–8.1)

## 2023-12-25 LAB — CBC WITH DIFFERENTIAL (CANCER CENTER ONLY)
Abs Immature Granulocytes: 0.02 10*3/uL (ref 0.00–0.07)
Basophils Absolute: 0.1 10*3/uL (ref 0.0–0.1)
Basophils Relative: 1 %
Eosinophils Absolute: 0.3 10*3/uL (ref 0.0–0.5)
Eosinophils Relative: 5 %
HCT: 40.2 % (ref 39.0–52.0)
Hemoglobin: 14.4 g/dL (ref 13.0–17.0)
Immature Granulocytes: 0 %
Lymphocytes Relative: 31 %
Lymphs Abs: 1.8 10*3/uL (ref 0.7–4.0)
MCH: 33.6 pg (ref 26.0–34.0)
MCHC: 35.8 g/dL (ref 30.0–36.0)
MCV: 93.9 fL (ref 80.0–100.0)
Monocytes Absolute: 1 10*3/uL (ref 0.1–1.0)
Monocytes Relative: 17 %
Neutro Abs: 2.5 10*3/uL (ref 1.7–7.7)
Neutrophils Relative %: 46 %
Platelet Count: 301 10*3/uL (ref 150–400)
RBC: 4.28 MIL/uL (ref 4.22–5.81)
RDW: 13.1 % (ref 11.5–15.5)
WBC Count: 5.6 10*3/uL (ref 4.0–10.5)
nRBC: 0 % (ref 0.0–0.2)

## 2023-12-25 LAB — LACTATE DEHYDROGENASE: LDH: 179 U/L (ref 98–192)

## 2023-12-25 MED ORDER — OXYCODONE HCL 5 MG PO TABS
5.0000 mg | ORAL_TABLET | Freq: Four times a day (QID) | ORAL | 0 refills | Status: AC | PRN
  Filled 2023-12-27 (×2): qty 28, 7d supply, fill #0

## 2023-12-25 NOTE — Patient Instructions (Signed)
 Diagnostic Clinic Office Visit Discharge Information and Instructions  Thank you for choosing [facility] for your healthcare needs.  Below is a summary of today's discussion, along with our contact information and an outline of what to expect next.  Reason for Visit:  bone lesion  Proposed Diagnostic Care Plan: Labs collected today  We will follow up with your lab results and MRI results.   What to Expect: - Generally, when lab tests are ordered the results can take up to 1 week for results to be available.  At that point, we will contact you to discuss your results with you.  Unless there is a critical result, we will typically wait for all of your lab results to be available before contacting you. - If a biopsy is part of your Care Plan, those results can take on average 7-10 days to result.  Once results are available, we will contact you to discuss your pathology results and any next steps. - If you have additional imaging ordered, such as a CT Scan, MRI, Ultrasound, Bone Scan, or PET scan, your imaging will need to be authorized then scheduled with the earliest available appointment.  You may be asked to travel to another hospital within [facility] who has a sooner availability, please consider doing so if asked. - If you use MyChart, your results will be available to you in the MyChart portal.  Your provider will be in touch with you as soon as all of your results are available to be discussed.  Your Diagnostic Clinic Provider:  Brendan Gadson Walisiewicz PA-C and Dr. Rosaline Coma Your Diagnostic Navigator:  Jetta Morrow RN, office number [phone]  If you or your caregiver have number blocking on your cell phones, please ensure the cancer center's numbers are not blocked.  If you are not a registered MyChart user, please consider enrolling in MyChart to receive your test results and visit notes.  You can also access your discharge instructions electronically.  MyChart also gives you an  electronic means to communicate with your Care Team instead of needing to call in to the cancer center.  We appreciate you trusting us  with your healthcare and look forward to partnering with you as we work to uncover what your potential diagnosis may be.  Please do not hesitate to reach out at any point with questions or concerns.

## 2023-12-26 LAB — KAPPA/LAMBDA LIGHT CHAINS
Kappa free light chain: 21.5 mg/L — ABNORMAL HIGH (ref 3.3–19.4)
Kappa, lambda light chain ratio: 1.32 (ref 0.26–1.65)
Lambda free light chains: 16.3 mg/L (ref 5.7–26.3)

## 2023-12-26 LAB — PROSTATE-SPECIFIC AG, SERUM (LABCORP): Prostate Specific Ag, Serum: 0.5 ng/mL (ref 0.0–4.0)

## 2023-12-27 ENCOUNTER — Other Ambulatory Visit: Payer: Self-pay

## 2023-12-29 LAB — MULTIPLE MYELOMA PANEL, SERUM
Albumin SerPl Elph-Mcnc: 4.1 g/dL (ref 2.9–4.4)
Albumin/Glob SerPl: 1.4 (ref 0.7–1.7)
Alpha 1: 0.2 g/dL (ref 0.0–0.4)
Alpha2 Glob SerPl Elph-Mcnc: 0.6 g/dL (ref 0.4–1.0)
B-Globulin SerPl Elph-Mcnc: 1.2 g/dL (ref 0.7–1.3)
Gamma Glob SerPl Elph-Mcnc: 1.1 g/dL (ref 0.4–1.8)
Globulin, Total: 3.1 g/dL (ref 2.2–3.9)
IgA: 351 mg/dL (ref 90–386)
IgG (Immunoglobin G), Serum: 1209 mg/dL (ref 603–1613)
IgM (Immunoglobulin M), Srm: 64 mg/dL (ref 20–172)
Total Protein ELP: 7.2 g/dL (ref 6.0–8.5)

## 2023-12-30 ENCOUNTER — Encounter: Payer: Self-pay | Admitting: Medical Oncology

## 2023-12-30 NOTE — Progress Notes (Signed)
 [facility]  Return call to patient who called requesting his lab results that were drawn on 12/25/23, states he is unable to see them in MyChart. Reviewed patient's labs with Wyline Hearing, PA-C. Call back to patient to inform him that labs resulted with no concerning issues. Multiple myeloma resulted with no concerning issue, as well as PSA is normal. Informed patient that his liver enzymes were slightly elevated and inquired with him if he was taking tylenol , to which patient confirmed that he was taking more tylenol  for his pain. Patient aware to ease off on tylenol  and increase water intake. Patient gave verbal understanding to lab results and was informed that once MRI has resulted (scheduled for 01/02/24) PA-C will call him to discuss results.  Patient encouraged to call me with questions/concerns in the meantime.   Esperanza Hedges, RN, BSN, [facility] Oncology Nurse Navigator, [facility] 12/30/2023 11:26 AM

## 2023-12-31 ENCOUNTER — Encounter: Payer: Self-pay | Admitting: Orthopedic Surgery

## 2024-01-01 ENCOUNTER — Ambulatory Visit (INDEPENDENT_AMBULATORY_CARE_PROVIDER_SITE_OTHER): Admitting: Family Medicine

## 2024-01-01 ENCOUNTER — Encounter: Payer: Self-pay | Admitting: Family Medicine

## 2024-01-01 ENCOUNTER — Other Ambulatory Visit: Payer: Self-pay

## 2024-01-01 VITALS — BP 142/103 | HR 90 | Ht 70.0 in | Wt 222.4 lb

## 2024-01-01 DIAGNOSIS — I10 Essential (primary) hypertension: Secondary | ICD-10-CM | POA: Diagnosis not present

## 2024-01-01 DIAGNOSIS — K0889 Other specified disorders of teeth and supporting structures: Secondary | ICD-10-CM | POA: Diagnosis not present

## 2024-01-01 MED ORDER — AMOXICILLIN 875 MG PO TABS
875.0000 mg | ORAL_TABLET | Freq: Two times a day (BID) | ORAL | 0 refills | Status: DC
Start: 2024-01-01 — End: 2024-01-06
  Filled 2024-01-01: qty 20, 10d supply, fill #0

## 2024-01-01 MED ORDER — LISINOPRIL-HYDROCHLOROTHIAZIDE 10-12.5 MG PO TABS
1.0000 | ORAL_TABLET | Freq: Every day | ORAL | 0 refills | Status: AC
  Filled 2024-01-01: qty 90, 90d supply, fill #0

## 2024-01-02 ENCOUNTER — Ambulatory Visit
Admission: RE | Admit: 2024-01-02 | Discharge: 2024-01-02 | Disposition: A | Discharge status: Home / Self Care | Source: Ambulatory Visit | Attending: Orthopedic Surgery | Admitting: Orthopedic Surgery

## 2024-01-02 ENCOUNTER — Encounter: Payer: Self-pay | Admitting: Family Medicine

## 2024-01-02 ENCOUNTER — Other Ambulatory Visit: Payer: Self-pay

## 2024-01-02 DIAGNOSIS — M79662 Pain in left lower leg: Secondary | ICD-10-CM | POA: Diagnosis not present

## 2024-01-02 DIAGNOSIS — M898X9 Other specified disorders of bone, unspecified site: Secondary | ICD-10-CM

## 2024-01-02 DIAGNOSIS — R936 Abnormal findings on diagnostic imaging of limbs: Secondary | ICD-10-CM | POA: Diagnosis not present
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
## 2024-01-06 ENCOUNTER — Emergency Department (HOSPITAL_BASED_OUTPATIENT_CLINIC_OR_DEPARTMENT_OTHER)
Admission: EM | Admit: 2024-01-06 | Discharge: 2024-01-06 | Disposition: A | Discharge status: Home / Self Care | Attending: Emergency Medicine | Admitting: Emergency Medicine

## 2024-01-06 ENCOUNTER — Encounter (HOSPITAL_BASED_OUTPATIENT_CLINIC_OR_DEPARTMENT_OTHER): Payer: Self-pay

## 2024-01-06 ENCOUNTER — Other Ambulatory Visit: Payer: Self-pay

## 2024-01-06 ENCOUNTER — Other Ambulatory Visit (HOSPITAL_BASED_OUTPATIENT_CLINIC_OR_DEPARTMENT_OTHER): Payer: Self-pay

## 2024-01-06 DIAGNOSIS — J45909 Unspecified asthma, uncomplicated: Secondary | ICD-10-CM | POA: Insufficient documentation

## 2024-01-06 DIAGNOSIS — F17210 Nicotine dependence, cigarettes, uncomplicated: Secondary | ICD-10-CM | POA: Insufficient documentation

## 2024-01-06 DIAGNOSIS — K0889 Other specified disorders of teeth and supporting structures: Secondary | ICD-10-CM | POA: Insufficient documentation

## 2024-01-06 DIAGNOSIS — Z419 Encounter for procedure for purposes other than remedying health state, unspecified: Secondary | ICD-10-CM | POA: Diagnosis not present

## 2024-01-06 MED ORDER — OXYCODONE-ACETAMINOPHEN 5-325 MG PO TABS: 1.0000 | ORAL_TABLET | Freq: Once | ORAL | Status: DC

## 2024-01-06 MED ORDER — CHLORHEXIDINE GLUCONATE 0.12 % MT SOLN
15.0000 mL | Freq: Two times a day (BID) | OROMUCOSAL | 0 refills | Status: AC
  Filled 2024-01-06: qty 473, 16d supply, fill #0

## 2024-01-06 MED ORDER — OXYCODONE HCL 5 MG PO TABS
2.5000 mg | ORAL_TABLET | Freq: Four times a day (QID) | ORAL | 0 refills | Status: DC | PRN
  Filled 2024-01-06: qty 10, 5d supply, fill #0

## 2024-01-06 MED ORDER — MORPHINE SULFATE (PF) 2 MG/ML IV SOLN
2.0000 mg | Freq: Once | INTRAVENOUS | Status: AC
  Administered 2024-01-06: 2 mg via INTRAVENOUS
  Filled 2024-01-06: qty 1

## 2024-01-06 MED ORDER — CLINDAMYCIN PHOSPHATE 300 MG/50ML IV SOLN
300.0000 mg | Freq: Once | INTRAVENOUS | Status: AC
  Administered 2024-01-06: 300 mg via INTRAVENOUS
  Filled 2024-01-06: qty 50

## 2024-01-06 MED ORDER — CLINDAMYCIN HCL 150 MG PO CAPS
300.0000 mg | ORAL_CAPSULE | Freq: Three times a day (TID) | ORAL | 0 refills | Status: AC
  Filled 2024-01-06: qty 30, 10d supply, fill #0
  Filled 2024-01-06: qty 60, 10d supply, fill #0

## 2024-01-06 NOTE — ED Triage Notes (Signed)
 Pt POV from home c/o headache and left sided facial pain and swelling. Pt describes headache as cluster headache and advises hx of same. Facial pain began 1 week ago, was seen at PCP and given amox but states no improvement in s/s. Does not currently have a dentist

## 2024-01-06 NOTE — ED Provider Notes (Signed)
 [place] EMERGENCY DEPARTMENT AT [facility] Provider Note   CSN: [id number] Arrival date & time: 01/06/24  0915     History  Chief Complaint  Patient presents with   Abscess    William Kane is a 43 y.o. male.  HPI   43 year old male presents emergency department with complaints of dental pain.  States that he said left upper dental pain for the past week or so.  Was seen by primary care 5 days ago and placed on amoxicillin  due to concern for dental infection.  Patient reports continued pain in the area.  Does report having mild facial swelling last night that has since improved upon awakening.  Has been taking Tylenol /ibuprofen  with some improvement of symptoms.  Presents emergency department for further assessment.  States he is not established care with dentistry yet.  Has known multiple dental caries.  Past medical history significant for asthma, cluster headaches, alcohol abuse with mood disorder, IBD  Home Medications Prior to Admission medications   Medication Sig Start Date End Date Taking? Authorizing Provider  clindamycin (CLEOCIN) 150 MG capsule Take 2 capsules (300 mg total) by mouth 3 (three) times daily for 10 days. 01/06/24 01/16/24 Yes Neil Balls A, PA  oxyCODONE  (ROXICODONE ) 5 MG immediate release tablet Take ONE-HALF tablet (2.5 mg total) by mouth every 6 (six) hours as needed for severe pain (pain score 7-10) or breakthrough pain. 01/06/24  Yes Neil Balls A, PA  acetaminophen  (TYLENOL ) 500 MG tablet Take 1,000 mg by mouth every 6 (six) hours as needed for mild pain or headache.    [provider]  albuterol  (ACCUNEB ) 1.25 MG/3ML nebulizer solution Take 1 ampule by nebulization every 6 (six) hours as needed for wheezing.    [provider]  albuterol  (VENTOLIN  HFA) 108 (90 Base) MCG/ACT inhaler Inhale 1-2 puffs into the lungs every 6 (six) hours as needed for wheezing or shortness of breath. 12/20/23   Abraham Abo, MD   colchicine  0.6 MG tablet Take 1 tablet (0.6 mg total) by mouth daily. 11/25/23   Abraham Abo, MD  cyclobenzaprine  (FLEXERIL ) 10 MG tablet Take 1 tablet (10 mg total) by mouth 2 (two) times daily as needed for muscle spasms. 11/17/23   [place] Butter, PA  dicyclomine  (BENTYL ) 20 MG tablet Take 1 tablet (20 mg total) by mouth 3 (three) times daily as needed for spasms. 07/25/23   Tiajuana Fluke, MD  lisinopril -hydrochlorothiazide  (ZESTORETIC ) 10-12.5 MG tablet Take 1 tablet by mouth daily. 01/01/24   Abraham Abo, MD  meloxicam  (MOBIC ) 15 MG tablet Take 1 tablet (15 mg total) by mouth daily. 08/05/23 08/04/24  Bryson Carbine, MD  methylPREDNISolone  (MEDROL  DOSEPAK) 4 MG TBPK tablet See box instructions 11/17/23   Neil Balls A, PA  mometasone -formoterol  (DULERA ) 100-5 MCG/ACT AERO Inhale 2 puffs into the lungs 2 (two) times daily. 11/25/23   Abraham Abo, MD  ondansetron  (ZOFRAN ) 4 MG tablet Take 1 tablet (4 mg total) by mouth every 6 (six) hours as needed for nausea. 07/25/23   Tiajuana Fluke, MD  sildenafil  (VIAGRA ) 100 MG tablet Take 0.5-1 tablets (50-100 mg total) by mouth daily as needed for erectile dysfunction. 10/02/23   Abraham Abo, MD      Allergies    Naproxen     Review of Systems   Review of Systems  All other systems reviewed and are negative.   Physical Exam Updated Vital Signs BP (!) 146/116 (BP Location: Right Arm)   Pulse (!) 111  Temp 98.5 F (36.9 C) (Oral)   Resp 16   Ht 5\' 10"  (1.778 m)   Wt 102.5 kg   SpO2 96%   BMI 32.43 kg/m  Physical Exam Vitals and nursing note reviewed.  Constitutional:      General: He is not in acute distress.    Appearance: He is well-developed.  HENT:     Head: Normocephalic and atraumatic.     Mouth/Throat:      Comments: Patient with multiple dental caries present.  Tooth of concern indicated above.  Overlying gingival tenderness without evidence of periapical abscess.  No sublingual or submandibular swelling.   No trismus.  Uvula midline rises symmetrically with phonation. Eyes:     Conjunctiva/sclera: Conjunctivae normal.  Cardiovascular:     Rate and Rhythm: Normal rate and regular rhythm.     Heart sounds: No murmur heard. Pulmonary:     Effort: Pulmonary effort is normal. No respiratory distress.     Breath sounds: Normal breath sounds.  Abdominal:     Palpations: Abdomen is soft.     Tenderness: There is no abdominal tenderness.  Musculoskeletal:        General: No swelling.     Cervical back: Neck supple.  Skin:    General: Skin is warm and dry.     Capillary Refill: Capillary refill takes less than 2 seconds.  Neurological:     Mental Status: He is alert.  Psychiatric:        Mood and Affect: Mood normal.     ED Results / Procedures / Treatments   Labs (all labs ordered are listed, but only abnormal results are displayed) Labs Reviewed - No data to display  EKG None  Radiology No results found.  Procedures Procedures    Medications Ordered in ED Medications  clindamycin (CLEOCIN) IVPB 300 mg (has no administration in time range)  morphine  (PF) 2 MG/ML injection 2 mg (2 mg Intravenous Given 01/06/24 0955)    ED Course/ Medical Decision Making/ A&P                                 Medical Decision Making Risk Prescription drug management.   This patient presents to the ED for concern of dental pain, this involves an extensive number of treatment options, and is a complaint that carries with it a high risk of complications and morbidity.  The differential diagnosis includes Ludwig angina, Lemierre's disease, periorbital abscess, peritonsillar abscess, necrotizing ulcerative gingivitis, other  Co morbidities that complicate the patient evaluation  See HPI   Additional history obtained:  Additional history obtained from EMR External records from outside source obtained and reviewed including hospital records   Lab Tests:  N/a   Imaging Studies  ordered:  N/a   Cardiac Monitoring: / EKG:  N/a   Consultations Obtained:  N/a   Problem List / ED Course / Critical interventions / Medication management  Dental pain I ordered medication including clindamycin, morphine    Reevaluation of the patient after these medicines showed that the patient improved I have reviewed the patients home medicines and have made adjustments as needed   Social Determinants of Health:  Chronic cigarette use.  Denies illicit drug use.   Test / Admission - Considered:  Dental pain Vitals signs significant for htn. Otherwise within normal range and stable throughout visit. 43 year old male presents emergency department with complaints of dental pain.  States that he  said left upper dental pain for the past week or so.  Was seen by primary care 5 days ago and placed on amoxicillin  due to concern for dental infection.  Patient reports continued pain in the area.  Does report having mild facial swelling last night that has since improved upon awakening.  Has been taking Tylenol /ibuprofen  with some improvement of symptoms.  Presents emergency department for further assessment.  States he is not established care with dentistry yet.  Has known multiple dental caries. On exam, tenderness along gingiva as above.  No evidence clinically of Ludwig angina, obvious abscess.  Given that patient has failed amoxicillin  since that he has been on it for the past 5 days without significant improvement.  Will escalate antibiotic therapy to clindamycin.  Will recommend adequate oral hygiene and follow-up with dental specialist in the outpatient setting.  Treatment plan discussed with patient and he acknowledged understanding was agreeable to said plan.  Patient overall well-appearing, afebrile in no acute distress, tolerating p.o. without difficulty. Worrisome signs and symptoms were discussed with the patient, and the patient acknowledged understanding to return to the ED  if noticed. Patient was stable upon discharge.         Final Clinical Impression(s) / ED Diagnoses Final diagnoses:  Pain, dental    Rx / DC Orders ED Discharge Orders     None         Vina Butter, [place] 01/06/24 1017    Scarlette Currier, MD 01/07/24 [phone]

## 2024-01-06 NOTE — ED Notes (Signed)
 Pt advises pt meds have not decreased his pain; requesting additional meds before discharge. PA aware

## 2024-01-06 NOTE — Discharge Instructions (Addendum)
 As discussed, we will place you on a stronger antibiotic than the amoxicillin  that you were placed on.  Please do not take these together.  Will send in medicine for pain.  You may take this in addition to the Tylenol /ibuprofen  at baseline.  Will send a referral to the dentistry clinic or Maryan Smalling as well as attached resource guide to dental specialist in the area.  Please do not hesitate to return if the worrisome signs and symptoms we discussed become apparent.

## 2024-01-13 ENCOUNTER — Other Ambulatory Visit: Payer: Self-pay

## 2024-01-13 ENCOUNTER — Ambulatory Visit: Admitting: Orthopedic Surgery

## 2024-01-13 ENCOUNTER — Encounter: Payer: Self-pay | Admitting: Orthopedic Surgery

## 2024-01-13 DIAGNOSIS — M898X9 Other specified disorders of bone, unspecified site: Secondary | ICD-10-CM | POA: Diagnosis not present

## 2024-01-13 MED ORDER — OXYCODONE HCL 5 MG PO TABS
2.5000 mg | ORAL_TABLET | Freq: Four times a day (QID) | ORAL | 0 refills | Status: DC | PRN
  Filled 2024-01-13: qty 10, 5d supply, fill #0
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
## 2024-01-15 ENCOUNTER — Encounter: Payer: Self-pay | Admitting: Medical Oncology

## 2024-01-21 ENCOUNTER — Other Ambulatory Visit: Payer: Self-pay

## 2024-01-21 ENCOUNTER — Encounter (HOSPITAL_COMMUNITY): Payer: Self-pay | Admitting: Orthopedic Surgery

## 2024-01-21 NOTE — Progress Notes (Addendum)
 PCP - Abraham Abo, MD  Cardiologist -   PPM/ICD - denies Device Orders - n/a Rep Notified - n/a  Chest x-ray - 09-04-23 EKG - 07-22-23 Stress Test - denies ECHO - denies Cardiac Cath - denies  CPAP - denies  DM denies  Blood Thinner Instructions: denies Aspirin Instructions: n/a  ERAS Protcol - clear liquids until 4:30 am  COVID TEST- n/a  Anesthesia review: yes, patient reported no longer having a tooth completed course of antibiotics  Patient verbally denies any shortness of breath, fever, cough and chest pain during phone call   -------------  SDW INSTRUCTIONS given:  Your procedure is scheduled on May 28,2025.  Report to [facility] Main Entrance "A" at 5:30 A.M., and check in at the Admitting office.  Call this number if you have problems the morning of surgery:  [phone]   Remember:  Do not eat after midnight the night before your surgery  You may drink clear liquids until 4:30 the morning of your surgery.   Clear liquids allowed are: Water, Non-Citrus Juices (without pulp), Carbonated Beverages, Clear Tea, Black Coffee Only, and Gatorade    Take these medicines the morning of surgery with A SIP OF WATER  acetaminophen  (TYLENOL )  albuterol  (VENTOLIN  HFA)  cyclobenzaprine  (FLEXERIL )  mometasone -formoterol  (DULERA )   As of today, STOP taking any Aspirin (unless otherwise instructed by your surgeon) Aleve , Naproxen , Ibuprofen , Motrin , Advil , Goody's, BC's, all herbal medications, fish oil, and all vitamins. THIS INCLUDES YOUR meloxicam  (MOBIC )                       Do not wear jewelry, make up, or nail polish            Do not wear lotions, powders, perfumes/colognes, or deodorant.            Do not shave 48 hours prior to surgery.  Men may shave face and neck.            Do not bring valuables to the hospital.            [facility] is not responsible for any belongings or valuables.  Do NOT Smoke (Tobacco/Vaping) 24 hours prior to your procedure If  you use a CPAP at night, you may bring all equipment for your overnight stay.   Contacts, glasses, dentures or bridgework may not be worn into surgery.      For patients admitted to the hospital, discharge time will be determined by your treatment team.   Patients discharged the day of surgery will not be allowed to drive home, and someone needs to stay with them for 24 hours.    Special instructions:   [place]- Preparing For Surgery  Before surgery, you can play an important role. Because skin is not sterile, your skin needs to be as free of germs as possible. You can reduce the number of germs on your skin by washing with CHG (chlorahexidine gluconate) Soap before surgery.  CHG is an antiseptic cleaner which kills germs and bonds with the skin to continue killing germs even after washing.    Oral Hygiene is also important to reduce your risk of infection.  Remember - BRUSH YOUR TEETH THE MORNING OF SURGERY WITH YOUR REGULAR TOOTHPASTE  Please do not use if you have an allergy to CHG or antibacterial soaps. If your skin becomes reddened/irritated stop using the CHG.  Do not shave (including legs and underarms) for at least 48 hours prior  to first CHG shower. It is OK to shave your face.  Please follow these instructions carefully.   Shower the NIGHT BEFORE SURGERY and the MORNING OF SURGERY with DIAL Soap.   Pat yourself dry with a CLEAN TOWEL.  Wear CLEAN PAJAMAS to bed the night before surgery  Place CLEAN SHEETS on your bed the night of your first shower and DO NOT SLEEP WITH PETS.   Day of Surgery: Please shower morning of surgery  Wear Clean/Comfortable clothing the morning of surgery Do not apply any deodorants/lotions.   Remember to brush your teeth WITH YOUR REGULAR TOOTHPASTE.   Questions were answered. Patient verbalized understanding of instructions.

## 2024-01-21 NOTE — Anesthesia Preprocedure Evaluation (Signed)
 Anesthesia Evaluation  Patient identified by MRN, date of birth, ID band Patient awake    Reviewed: Allergy & Precautions, NPO status , Patient's Chart, lab work & pertinent test results  History of Anesthesia Complications Negative for: history of anesthetic complications  Airway Mallampati: II  TM Distance: >3 FB Neck ROM: Full    Dental  (+) Dental Advisory Given, Chipped   Pulmonary asthma , Current Smoker and Patient abstained from smoking.   Pulmonary exam normal        Cardiovascular hypertension, Pt. on medications Normal cardiovascular exam     Neuro/Psych  Headaches  Neuromuscular disease  negative psych ROS   GI/Hepatic negative GI ROS,,,(+)     substance abuse  alcohol use and marijuana use  Endo/Other   Obesity   Renal/GU negative Renal ROS     Musculoskeletal  Osgood-Schlatter's disease    Abdominal   Peds  Hematology negative hematology ROS (+)   Anesthesia Other Findings   Reproductive/Obstetrics                             Anesthesia Physical Anesthesia Plan  ASA: 3  Anesthesia Plan: General   Post-op Pain Management: Tylenol  PO (pre-op)* and Ketamine IV*   Induction: Intravenous  PONV Risk Score and Plan: 1 and Treatment may vary due to age or medical condition, Ondansetron , Dexamethasone  and Midazolam  Airway Management Planned: LMA  Additional Equipment: None  Intra-op Plan:   Post-operative Plan: Extubation in OR  Informed Consent: I have reviewed the patients History and Physical, chart, labs and discussed the procedure including the risks, benefits and alternatives for the proposed anesthesia with the patient or authorized representative who has indicated his/her understanding and acceptance.     Dental advisory given  Plan Discussed with: CRNA and Anesthesiologist  Anesthesia Plan Comments:        Anesthesia Quick Evaluation

## 2024-01-22 ENCOUNTER — Ambulatory Visit (HOSPITAL_COMMUNITY)
Admission: RE | Admit: 2024-01-22 | Discharge: 2024-01-22 | Disposition: A | Discharge status: Home / Self Care | Attending: Orthopedic Surgery | Admitting: Orthopedic Surgery

## 2024-01-22 ENCOUNTER — Ambulatory Visit (HOSPITAL_COMMUNITY): Payer: Self-pay | Admitting: Certified Registered"

## 2024-01-22 ENCOUNTER — Other Ambulatory Visit: Payer: Self-pay

## 2024-01-22 ENCOUNTER — Encounter (HOSPITAL_COMMUNITY): Payer: Self-pay | Admitting: Orthopedic Surgery

## 2024-01-22 ENCOUNTER — Encounter (HOSPITAL_COMMUNITY)
Admission: RE | Disposition: A | Discharge status: Home / Self Care | Payer: Self-pay | Source: Home / Self Care | Attending: Orthopedic Surgery

## 2024-01-22 ENCOUNTER — Ambulatory Visit (HOSPITAL_COMMUNITY)

## 2024-01-22 DIAGNOSIS — M86662 Other chronic osteomyelitis, left tibia and fibula: Secondary | ICD-10-CM | POA: Diagnosis not present

## 2024-01-22 DIAGNOSIS — F17210 Nicotine dependence, cigarettes, uncomplicated: Secondary | ICD-10-CM

## 2024-01-22 DIAGNOSIS — I10 Essential (primary) hypertension: Secondary | ICD-10-CM | POA: Diagnosis not present

## 2024-01-22 DIAGNOSIS — G709 Myoneural disorder, unspecified: Secondary | ICD-10-CM | POA: Diagnosis not present

## 2024-01-22 DIAGNOSIS — M86461 Chronic osteomyelitis with draining sinus, right tibia and fibula: Secondary | ICD-10-CM

## 2024-01-22 DIAGNOSIS — M899 Disorder of bone, unspecified: Secondary | ICD-10-CM | POA: Diagnosis present

## 2024-01-22 DIAGNOSIS — E669 Obesity, unspecified: Secondary | ICD-10-CM | POA: Diagnosis not present

## 2024-01-22 DIAGNOSIS — M869 Osteomyelitis, unspecified: Secondary | ICD-10-CM

## 2024-01-22 DIAGNOSIS — Z79899 Other long term (current) drug therapy: Secondary | ICD-10-CM | POA: Insufficient documentation

## 2024-01-22 DIAGNOSIS — J45909 Unspecified asthma, uncomplicated: Secondary | ICD-10-CM

## 2024-01-22 DIAGNOSIS — M92529 Juvenile osteochondrosis of tibia tubercle, unspecified leg: Secondary | ICD-10-CM | POA: Diagnosis not present

## 2024-01-22 DIAGNOSIS — Z6832 Body mass index (BMI) 32.0-32.9, adult: Secondary | ICD-10-CM | POA: Insufficient documentation

## 2024-01-22 HISTORY — DX: Essential (primary) hypertension: I10

## 2024-01-22 HISTORY — PX: TIBIA OSTEOTOMY: SHX1065

## 2024-01-22 SURGERY — OSTEOTOMY, TIBIA
Anesthesia: General | Laterality: Left

## 2024-01-22 MED ORDER — LACTATED RINGERS IV SOLN: INTRAVENOUS | Status: DC

## 2024-01-22 MED ORDER — ONDANSETRON HCL 4 MG/2ML IJ SOLN
INTRAMUSCULAR | Status: DC | PRN
  Administered 2024-01-22: 4 mg via INTRAVENOUS

## 2024-01-22 MED ORDER — 0.9 % SODIUM CHLORIDE (POUR BTL) OPTIME
TOPICAL | Status: DC | PRN
  Administered 2024-01-22: 1000 mL

## 2024-01-22 MED ORDER — ACETAMINOPHEN 500 MG PO TABS
1000.0000 mg | ORAL_TABLET | Freq: Once | ORAL | Status: AC
  Administered 2024-01-22: 1000 mg via ORAL
  Filled 2024-01-22: qty 2

## 2024-01-22 MED ORDER — PROPOFOL 10 MG/ML IV BOLUS
INTRAVENOUS | Status: AC
  Filled 2024-01-22: qty 20

## 2024-01-22 MED ORDER — LIDOCAINE 2% (20 MG/ML) 5 ML SYRINGE
INTRAMUSCULAR | Status: DC | PRN
  Administered 2024-01-22: 60 mg via INTRAVENOUS

## 2024-01-22 MED ORDER — PHENYLEPHRINE HCL-NACL 20-0.9 MG/250ML-% IV SOLN
INTRAVENOUS | Status: AC
Start: 2024-01-22 — End: ?
  Filled 2024-01-22: qty 250

## 2024-01-22 MED ORDER — VASHE WOUND IRRIGATION OPTIME
TOPICAL | Status: DC | PRN
  Administered 2024-01-22: 34 [oz_av]

## 2024-01-22 MED ORDER — VANCOMYCIN HCL 1000 MG IV SOLR
INTRAVENOUS | Status: AC
Start: 2024-01-22 — End: ?
  Filled 2024-01-22: qty 20

## 2024-01-22 MED ORDER — VANCOMYCIN HCL 1000 MG IV SOLR
INTRAVENOUS | Status: DC | PRN
  Administered 2024-01-22: 1000 mg

## 2024-01-22 MED ORDER — PROPOFOL 1000 MG/100ML IV EMUL
INTRAVENOUS | Status: AC
Start: 2024-01-22 — End: ?
  Filled 2024-01-22: qty 300

## 2024-01-22 MED ORDER — FENTANYL CITRATE (PF) 100 MCG/2ML IJ SOLN
INTRAMUSCULAR | Status: AC
Start: 2024-01-22 — End: ?
  Filled 2024-01-22: qty 2

## 2024-01-22 MED ORDER — MIDAZOLAM HCL 2 MG/2ML IJ SOLN
INTRAMUSCULAR | Status: DC | PRN
  Administered 2024-01-22: 2 mg via INTRAVENOUS

## 2024-01-22 MED ORDER — CEFAZOLIN SODIUM-DEXTROSE 2-4 GM/100ML-% IV SOLN
2.0000 g | INTRAVENOUS | Status: AC
  Administered 2024-01-22: 2 g via INTRAVENOUS
  Filled 2024-01-22: qty 100

## 2024-01-22 MED ORDER — FENTANYL CITRATE (PF) 250 MCG/5ML IJ SOLN
INTRAMUSCULAR | Status: AC
  Filled 2024-01-22: qty 5

## 2024-01-22 MED ORDER — SODIUM CHLORIDE 0.9 % IV SOLN: 12.5000 mg | INTRAVENOUS | Status: DC | PRN

## 2024-01-22 MED ORDER — HYDROMORPHONE HCL 1 MG/ML IJ SOLN: 0.5000 mg | INTRAMUSCULAR | Status: DC | PRN

## 2024-01-22 MED ORDER — OXYCODONE HCL 5 MG PO TABS
ORAL_TABLET | ORAL | Status: AC
  Filled 2024-01-22: qty 1

## 2024-01-22 MED ORDER — PROPOFOL 10 MG/ML IV BOLUS
INTRAVENOUS | Status: DC | PRN
  Administered 2024-01-22: 100 mg via INTRAVENOUS
  Administered 2024-01-22: 150 ug/kg/min via INTRAVENOUS
  Administered 2024-01-22: 200 mg via INTRAVENOUS

## 2024-01-22 MED ORDER — CHLORHEXIDINE GLUCONATE 0.12 % MT SOLN
15.0000 mL | Freq: Once | OROMUCOSAL | Status: DC
  Filled 2024-01-22: qty 15

## 2024-01-22 MED ORDER — MIDAZOLAM HCL 2 MG/2ML IJ SOLN
INTRAMUSCULAR | Status: AC
  Filled 2024-01-22: qty 2

## 2024-01-22 MED ORDER — KETAMINE HCL 10 MG/ML IJ SOLN
INTRAMUSCULAR | Status: DC | PRN
  Administered 2024-01-22: 20 mg via INTRAVENOUS

## 2024-01-22 MED ORDER — KETAMINE HCL 50 MG/5ML IJ SOSY
PREFILLED_SYRINGE | INTRAMUSCULAR | Status: AC
  Filled 2024-01-22: qty 5

## 2024-01-22 MED ORDER — DEXAMETHASONE SODIUM PHOSPHATE 10 MG/ML IJ SOLN
INTRAMUSCULAR | Status: DC | PRN
  Administered 2024-01-22: 10 mg via INTRAVENOUS

## 2024-01-22 MED ORDER — LACTATED RINGERS IV SOLN
INTRAVENOUS | Status: DC | PRN
Start: 2024-01-22 — End: 2024-01-22

## 2024-01-22 MED ORDER — OXYCODONE HCL 5 MG PO TABS
5.0000 mg | ORAL_TABLET | Freq: Once | ORAL | Status: AC | PRN
  Administered 2024-01-22: 5 mg via ORAL

## 2024-01-22 MED ORDER — FENTANYL CITRATE (PF) 250 MCG/5ML IJ SOLN
INTRAMUSCULAR | Status: DC | PRN
  Administered 2024-01-22: 100 ug via INTRAVENOUS
  Administered 2024-01-22: 50 ug via INTRAVENOUS

## 2024-01-22 MED ORDER — AMISULPRIDE (ANTIEMETIC) 5 MG/2ML IV SOLN: 10.0000 mg | Freq: Once | INTRAVENOUS | Status: DC | PRN

## 2024-01-22 MED ORDER — FENTANYL CITRATE (PF) 100 MCG/2ML IJ SOLN
25.0000 ug | INTRAMUSCULAR | Status: DC | PRN
  Administered 2024-01-22 (×3): 50 ug via INTRAVENOUS

## 2024-01-22 MED ORDER — OXYCODONE-ACETAMINOPHEN 5-325 MG PO TABS
1.0000 | ORAL_TABLET | ORAL | 0 refills | Status: DC | PRN
  Filled 2024-01-22: qty 30, 5d supply, fill #0

## 2024-01-22 MED ORDER — FENTANYL CITRATE (PF) 100 MCG/2ML IJ SOLN
INTRAMUSCULAR | Status: AC
  Filled 2024-01-22: qty 2

## 2024-01-22 MED ORDER — ORAL CARE MOUTH RINSE: 15.0000 mL | Freq: Once | OROMUCOSAL | Status: DC

## 2024-01-22 MED ORDER — OXYCODONE HCL 5 MG/5ML PO SOLN: 5.0000 mg | Freq: Once | ORAL | Status: AC | PRN

## 2024-01-22 MED ORDER — GLYCOPYRROLATE PF 0.2 MG/ML IJ SOSY
PREFILLED_SYRINGE | INTRAMUSCULAR | Status: DC | PRN
  Administered 2024-01-22: .2 mg via INTRAVENOUS

## 2024-01-22 SURGICAL SUPPLY — 41 items
BAG COUNTER SPONGE SURGICOUNT (BAG) ×1 IMPLANT
BLADE AVERAGE 25X9 (BLADE) IMPLANT
BLADE MINI RND TIP GREEN BEAV (BLADE) IMPLANT
BNDG COHESIVE 1X5 TAN STRL LF (GAUZE/BANDAGES/DRESSINGS) IMPLANT
BNDG COHESIVE 6X5 TAN ST LF (GAUZE/BANDAGES/DRESSINGS) IMPLANT
BNDG COMPR ESMARK 4X3 LF (GAUZE/BANDAGES/DRESSINGS) ×1 IMPLANT
BNDG GAUZE DERMACEA FLUFF 4 (GAUZE/BANDAGES/DRESSINGS) IMPLANT
CLEANSER WND VASHE 34 (WOUND CARE) IMPLANT
CORD BIPOLAR FORCEPS 12FT (ELECTRODE) ×1 IMPLANT
COTTON STERILE ROLL (GAUZE/BANDAGES/DRESSINGS) IMPLANT
COVER SURGICAL LIGHT HANDLE (MISCELLANEOUS) ×2 IMPLANT
CUFF TOURN SGL QUICK 18X4 (TOURNIQUET CUFF) IMPLANT
CUFF TRNQT CYL 24X4X16.5-23 (TOURNIQUET CUFF) IMPLANT
DRAPE OEC MINIVIEW 54X84 (DRAPES) IMPLANT
DRAPE U-SHAPE 47X51 STRL (DRAPES) ×1 IMPLANT
DRSG ADAPTIC 3X8 NADH LF (GAUZE/BANDAGES/DRESSINGS) IMPLANT
DURAPREP 26ML APPLICATOR (WOUND CARE) ×1 IMPLANT
ELECTRODE REM PT RTRN 9FT ADLT (ELECTROSURGICAL) ×1 IMPLANT
GAUZE PAD ABD 8X10 STRL (GAUZE/BANDAGES/DRESSINGS) IMPLANT
GAUZE SPONGE 4X4 12PLY STRL (GAUZE/BANDAGES/DRESSINGS) IMPLANT
GLOVE BIOGEL PI IND STRL 9 (GLOVE) ×1 IMPLANT
GLOVE SURG ORTHO 9.0 STRL STRW (GLOVE) ×1 IMPLANT
GOWN STRL REUS W/ TWL XL LVL3 (GOWN DISPOSABLE) ×2 IMPLANT
KIT BASIN OR (CUSTOM PROCEDURE TRAY) ×1 IMPLANT
KIT TURNOVER KIT B (KITS) ×1 IMPLANT
MANIFOLD NEPTUNE II (INSTRUMENTS) ×1 IMPLANT
NDL HYPO 25GX1X1/2 BEV (NEEDLE) IMPLANT
NEEDLE HYPO 25GX1X1/2 BEV (NEEDLE) IMPLANT
NS IRRIG 1000ML POUR BTL (IV SOLUTION) ×1 IMPLANT
PACK ORTHO EXTREMITY (CUSTOM PROCEDURE TRAY) ×1 IMPLANT
PAD ARMBOARD POSITIONER FOAM (MISCELLANEOUS) ×2 IMPLANT
PAD CAST 4YDX4 CTTN HI CHSV (CAST SUPPLIES) IMPLANT
SPECIMEN JAR SMALL (MISCELLANEOUS) ×1 IMPLANT
SUCTION TUBE FRAZIER 10FR DISP (SUCTIONS) IMPLANT
SUT ETHILON 2 0 FS 18 (SUTURE) IMPLANT
SUT VIC AB 2-0 FS1 27 (SUTURE) IMPLANT
SYR CONTROL 10ML LL (SYRINGE) IMPLANT
TOWEL GREEN STERILE (TOWEL DISPOSABLE) ×1 IMPLANT
TOWEL GREEN STERILE FF (TOWEL DISPOSABLE) ×1 IMPLANT
TUBE CONNECTING 12X1/4 (SUCTIONS) IMPLANT
WATER STERILE IRR 1000ML POUR (IV SOLUTION) ×1 IMPLANT

## 2024-01-22 NOTE — Transfer of Care (Signed)
 Immediate Anesthesia Transfer of Care Note  Patient: William Kane  Procedure(s) Performed: OSTEOTOMY, TIBIA (Left)  Patient Location: PACU  Anesthesia Type:General  Level of Consciousness: awake  Airway & Oxygen Therapy: Patient Spontanous Breathing  Post-op Assessment: Report given to RN  Post vital signs: stable  Last Vitals:  Vitals Value Taken Time  BP 126/97 01/22/24 0822  Temp 36.6 C 01/22/24 0822  Pulse 100 01/22/24 0827  Resp 12 01/22/24 0827  SpO2 98 % 01/22/24 0827  Vitals shown include unfiled device data.  Last Pain:  Vitals:   01/22/24 0822  TempSrc:   PainSc: 10-Worst pain ever      Patients Stated Pain Goal: 3 (01/22/24 0602)  Complications: No notable events documented.

## 2024-01-22 NOTE — Anesthesia Postprocedure Evaluation (Signed)
 Anesthesia Post Note  Patient: William Kane  Procedure(s) Performed: OSTEOTOMY, TIBIA (Left)     Patient location during evaluation: PACU Anesthesia Type: General Level of consciousness: awake and alert Pain management: pain level controlled Vital Signs Assessment: post-procedure vital signs reviewed and stable Respiratory status: spontaneous breathing, nonlabored ventilation and respiratory function stable Cardiovascular status: stable and blood pressure returned to baseline Anesthetic complications: no   No notable events documented.  Last Vitals:  Vitals:   01/22/24 0916 01/22/24 0924  BP: (!) 149/93   Pulse: 90 91  Resp: 15 11  Temp:  36.7 C  SpO2: 94% 96%                  Juventino Oppenheim

## 2024-01-22 NOTE — Op Note (Signed)
 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  PROCEDURE:  OSTEOTOMY, TIBIA  SURGEON:  Timothy Ford, MD  PHYSICIAN ASSISTANT:None ANESTHESIA:   General  PREOPERATIVE INDICATIONS:  William Kane is a  43 y.o. male with a diagnosis of Bone Lesion Left Tibia who failed conservative measures and elected for surgical management.    The risks benefits and alternatives were discussed with the patient preoperatively including but not limited to the risks of infection, bleeding, nerve injury, cardiopulmonary complications, the need for revision surgery, among others, and the patient was willing to proceed.  OPERATIVE IMPLANTS:   * No implants in log *  @ENCIMAGES @  OPERATIVE FINDINGS: Findings seem most consistent with osteomyelitis with a draining sinus tract.  Bone tissue was sent for micro and a separate tissue sample was sent for pathology evaluation.  OPERATIVE PROCEDURE: Patient was brought the operating room underwent general anesthetic.  After adequate levels anesthesia were obtained patient's left lower extremity was prepped using DuraPrep draped into a sterile field a timeout was called.  A longitudinal post posterior medial incision was made over the distal tibia.  The greater saphenous vein was protected.  This was carried down to the tibia.  There is a bony protuberance with a sinus tract.  A drill was used to drill around the area of the bone deformity this section of bone was resected.  There was a sinus draining tract.  This tissue was sent for cultures and bone tissue was also sent for pathology.  The wound was irrigated with Vashe.  The bone defect was packed with 1 g vancomycin powder.  Incision closed using 2-0 nylon a sterile dressing was applied patient was extubated taken the PACU in stable condition.   DISCHARGE  PLANNING:  Antibiotic duration: Preoperative antibiotics only  Weightbearing: Weightbearing as tolerated with a cam boot walker  Pain medication: Prescription for Percocet  Dressing care/ Wound VAC: Dry dressing  Ambulatory devices: Crutches  Discharge to: Home.  Follow-up: In the office 1 week post operative.

## 2024-01-22 NOTE — Progress Notes (Signed)
 Orthopedic Tech Progress Note Patient Details:  William Kane [birth year] [id number]  Ortho Devices Type of Ortho Device: CAM walker, Crutches Ortho Device/Splint Location: LLE Ortho Device/Splint Interventions: Ordered, Application, Adjustment   Post Interventions Patient Tolerated: Well, Ambulated well Instructions Provided: Poper ambulation with device, Care of device  Kermitt Pedlar 01/22/2024, 9:48 AM
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
## 2024-01-22 NOTE — Anesthesia Procedure Notes (Signed)
 Procedure Name: LMA Insertion Date/Time: 01/22/2024 7:40 AM  Performed by: Ralston Burkes, CRNAPatient Re-evaluated:Patient Re-evaluated prior to induction Oxygen Delivery Method: Circle system utilized Preoxygenation: Pre-oxygenation with 100% oxygen Induction Type: IV induction Ventilation: Mask ventilation without difficulty LMA: LMA inserted LMA Size: 5.0 Number of attempts: 1

## 2024-01-23 ENCOUNTER — Encounter (HOSPITAL_COMMUNITY): Payer: Self-pay | Admitting: Orthopedic Surgery

## 2024-01-24 LAB — SURGICAL PATHOLOGY

## 2024-01-27 ENCOUNTER — Ambulatory Visit: Payer: Self-pay | Admitting: Family

## 2024-01-27 LAB — AEROBIC/ANAEROBIC CULTURE W GRAM STAIN (SURGICAL/DEEP WOUND)
Culture: NO GROWTH
Gram Stain: NONE SEEN

## 2024-01-29 ENCOUNTER — Other Ambulatory Visit: Payer: Self-pay | Admitting: Physician Assistant

## 2024-01-29 ENCOUNTER — Other Ambulatory Visit: Payer: Self-pay

## 2024-01-29 ENCOUNTER — Encounter: Payer: Self-pay | Admitting: Family

## 2024-01-29 ENCOUNTER — Ambulatory Visit (INDEPENDENT_AMBULATORY_CARE_PROVIDER_SITE_OTHER): Admitting: Family

## 2024-01-29 DIAGNOSIS — M86461 Chronic osteomyelitis with draining sinus, right tibia and fibula: Secondary | ICD-10-CM

## 2024-01-29 MED ORDER — OXYCODONE-ACETAMINOPHEN 5-325 MG PO TABS
1.0000 | ORAL_TABLET | Freq: Four times a day (QID) | ORAL | 0 refills | Status: DC | PRN
  Filled 2024-01-29 – 2024-02-12 (×3): qty 30, 8d supply, fill #0
  Filled ????-??-??: fill #0

## 2024-01-29 MED ORDER — OXYCODONE-ACETAMINOPHEN 5-325 MG PO TABS
1.0000 | ORAL_TABLET | Freq: Four times a day (QID) | ORAL | 0 refills | Status: DC | PRN
  Filled 2024-01-29: qty 30, 7d supply, fill #0
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
## 2024-01-31 ENCOUNTER — Encounter: Payer: Self-pay | Admitting: Medical Oncology

## 2024-01-31 NOTE — Progress Notes (Signed)
 [facility]) for Malignancy  Hand-off Note  01/31/24 12:13 PM  William Kane 1980-11-06 [id number]  Cancer Care, Care Team: Amparo Balk, MD Alveta Awe, PA-C Jetta Morrow, [facility] Nurse Navigator  Yahye Siebert Kane was referred to Cancer Care on 12/23/2023 for evaluation of:  lytic lesion of bone.  The patient's diagnostic work-up included: Labs (CBC, CMP, Kappa/lambda light chains, LDH, Multiple Myeloma panel. PSA.   The patient was found to not have malignancy at this time, as evaluated for the reason for referral stated above.  The recommended follow-up provided to the patient includes: follow up with orthopedics.  We thank you for allowing us  to assist in William Kane's care.  The initial and most recent Progress Notes, labs, imaging, procedure(s), and/or consult notes have been routed to you through [facility] or faxed to your office for continuity of care.   Esperanza Hedges, RN, BSN, [facility] Nurse Navigator, [facility] 01/31/2024 12:13 PM

## 2024-02-04 ENCOUNTER — Other Ambulatory Visit: Payer: Self-pay

## 2024-02-05 ENCOUNTER — Inpatient Hospital Stay: Admitting: Physician Assistant

## 2024-02-05 ENCOUNTER — Encounter: Payer: Self-pay | Admitting: Family Medicine

## 2024-02-06 ENCOUNTER — Ambulatory Visit: Admitting: Family Medicine

## 2024-02-06 ENCOUNTER — Telehealth: Payer: Self-pay | Admitting: Orthopedic Surgery

## 2024-02-06 DIAGNOSIS — Z419 Encounter for procedure for purposes other than remedying health state, unspecified: Secondary | ICD-10-CM | POA: Diagnosis not present

## 2024-02-06 NOTE — Telephone Encounter (Signed)
 Patient called and needs a refill on pain medication. CB#[phone]

## 2024-02-07 ENCOUNTER — Other Ambulatory Visit: Payer: Self-pay

## 2024-02-07 ENCOUNTER — Other Ambulatory Visit: Payer: Self-pay | Admitting: Physician Assistant

## 2024-02-07 DIAGNOSIS — M86461 Chronic osteomyelitis with draining sinus, right tibia and fibula: Secondary | ICD-10-CM

## 2024-02-07 MED ORDER — OXYCODONE-ACETAMINOPHEN 5-325 MG PO TABS
1.0000 | ORAL_TABLET | Freq: Four times a day (QID) | ORAL | 0 refills | Status: DC | PRN
  Filled 2024-02-07: qty 30, 7d supply, fill #0

## 2024-02-07 NOTE — Telephone Encounter (Signed)
 01/19/2024 left tib osteotomy requesting refill on Oxycodone  5/325. Last refill in chart was 01/29/2024 #30 please advise.

## 2024-02-12 ENCOUNTER — Ambulatory Visit (INDEPENDENT_AMBULATORY_CARE_PROVIDER_SITE_OTHER): Admitting: Family

## 2024-02-12 ENCOUNTER — Other Ambulatory Visit: Payer: Self-pay

## 2024-02-12 ENCOUNTER — Encounter: Payer: Self-pay | Admitting: Family

## 2024-02-12 DIAGNOSIS — M79605 Pain in left leg: Secondary | ICD-10-CM

## 2024-02-12 MED ORDER — OXYCODONE HCL 5 MG PO CAPS
5.0000 mg | ORAL_CAPSULE | Freq: Three times a day (TID) | ORAL | 0 refills | Status: DC | PRN
  Filled 2024-02-12: qty 21, 7d supply, fill #0

## 2024-02-12 MED ORDER — OXYCODONE HCL 5 MG PO TABS
5.0000 mg | ORAL_TABLET | Freq: Three times a day (TID) | ORAL | 0 refills | Status: DC | PRN
  Filled 2024-02-12 – 2024-02-13 (×2): qty 21, 7d supply, fill #0

## 2024-02-12 MED ORDER — OXYCODONE HCL 5 MG PO TABS: 5.0000 mg | ORAL_TABLET | Freq: Three times a day (TID) | ORAL | 0 refills | Status: DC | PRN
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
## 2024-02-13 ENCOUNTER — Other Ambulatory Visit: Payer: Self-pay

## 2024-02-18 ENCOUNTER — Encounter (HOSPITAL_COMMUNITY): Payer: Self-pay

## 2024-02-18 ENCOUNTER — Other Ambulatory Visit: Payer: Self-pay

## 2024-02-18 ENCOUNTER — Emergency Department (HOSPITAL_COMMUNITY)
Admission: EM | Admit: 2024-02-18 | Discharge: 2024-02-18 | Disposition: A | Discharge status: Home / Self Care | Attending: Emergency Medicine | Admitting: Emergency Medicine

## 2024-02-18 ENCOUNTER — Emergency Department (HOSPITAL_COMMUNITY)

## 2024-02-18 DIAGNOSIS — S82235A Nondisplaced oblique fracture of shaft of left tibia, initial encounter for closed fracture: Secondary | ICD-10-CM | POA: Diagnosis not present

## 2024-02-18 DIAGNOSIS — M79605 Pain in left leg: Secondary | ICD-10-CM | POA: Diagnosis not present

## 2024-02-18 DIAGNOSIS — Z743 Need for continuous supervision: Secondary | ICD-10-CM | POA: Diagnosis not present

## 2024-02-18 DIAGNOSIS — S8992XA Unspecified injury of left lower leg, initial encounter: Secondary | ICD-10-CM | POA: Diagnosis present

## 2024-02-18 DIAGNOSIS — R600 Localized edema: Secondary | ICD-10-CM | POA: Diagnosis not present

## 2024-02-18 DIAGNOSIS — S82245A Nondisplaced spiral fracture of shaft of left tibia, initial encounter for closed fracture: Secondary | ICD-10-CM | POA: Diagnosis not present

## 2024-02-18 DIAGNOSIS — M7989 Other specified soft tissue disorders: Secondary | ICD-10-CM | POA: Diagnosis not present

## 2024-02-18 DIAGNOSIS — W228XXA Striking against or struck by other objects, initial encounter: Secondary | ICD-10-CM | POA: Insufficient documentation

## 2024-02-18 DIAGNOSIS — I10 Essential (primary) hypertension: Secondary | ICD-10-CM | POA: Diagnosis not present

## 2024-02-18 DIAGNOSIS — M25572 Pain in left ankle and joints of left foot: Secondary | ICD-10-CM | POA: Diagnosis not present

## 2024-02-18 MED ORDER — HYDROMORPHONE HCL 1 MG/ML IJ SOLN
1.0000 mg | Freq: Once | INTRAMUSCULAR | Status: AC
  Administered 2024-02-18: 1 mg via INTRAVENOUS
  Filled 2024-02-18: qty 1

## 2024-02-18 MED ORDER — OXYCODONE-ACETAMINOPHEN 5-325 MG PO TABS
1.0000 | ORAL_TABLET | ORAL | 0 refills | Status: DC | PRN
  Filled 2024-02-18 (×2): qty 15, 3d supply, fill #0
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
## 2024-02-18 NOTE — Discharge Instructions (Signed)
 Your history, exam, and evaluation today revealed you do indeed have a fracture to your left tibia just below where you had your recent procedure/surgery.  I spoke to your surgeon, Dr. Harden, who reviewed the images and case and feel you are safe for discharge home to see him in clinic in 2 weeks.  He recommended you being nonweightbearing with that so use the fracture boot and assistive device with either crutches or a walker.  Please use pain medicine to help with symptoms and rest.  If any symptoms change or worsen acutely, return to the nearest emergency department.
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
## 2024-02-18 NOTE — Progress Notes (Signed)
 Orthopedic Tech Progress Note Patient Details:  William Kane 12/13/1980 [id number]  Ortho Devices Type of Ortho Device: CAM walker Ortho Device/Splint Location: left Ortho Device/Splint Interventions: Ordered, Application, Adjustment   Post Interventions Patient Tolerated: Well Instructions Provided: Adjustment of device, Care of device  [name] 02/18/2024, 2:09 PM

## 2024-02-18 NOTE — Discharge Summary (Signed)
 William Kane, BSN, RN, UTAH [phone] Pt qualifies for DME [facility]) rollator.  DME  ordered through [employer].  Ryan of [employer] notified to deliver DME to pt room prior to D/C home.

## 2024-02-19 ENCOUNTER — Telehealth: Payer: Self-pay | Admitting: Orthopedic Surgery

## 2024-02-19 ENCOUNTER — Other Ambulatory Visit: Payer: Self-pay

## 2024-02-19 NOTE — Telephone Encounter (Signed)
 Pt states that he has a court date for tomorrow 02/20/24 and is unable to attend. Pt needs a letter faxed to [phone] his lawyer Mr. Lamb, stating that he is under doctors care and is to be completely off the leg.    Patient's callback number [phone]

## 2024-02-19 NOTE — Telephone Encounter (Signed)
 Emailed to the lawyer as requested.

## 2024-02-19 NOTE — Telephone Encounter (Signed)
 Letter written. The pt was seen in the ER yesterday and Dr. Harden was consulted after obtaining CT scan LLE. Advised pt should continue with fx boot strict NWTB and if he is able to do this fx should heal well. If not he would require surgical correction. Letter faxed to lawyer as requested and to call with any questions.

## 2024-02-19 NOTE — Telephone Encounter (Signed)
 Patient called and said the lawyer fax machine is down and is there any way you can email him the letter. His email address [email or web address]. He said that we faxed it but it shouldn't go through. [phone]

## 2024-02-21 ENCOUNTER — Other Ambulatory Visit: Payer: Self-pay | Admitting: Orthopedic Surgery

## 2024-02-21 ENCOUNTER — Telehealth: Payer: Self-pay | Admitting: Family

## 2024-02-21 ENCOUNTER — Other Ambulatory Visit: Payer: Self-pay

## 2024-02-21 ENCOUNTER — Other Ambulatory Visit: Payer: Self-pay | Admitting: Family

## 2024-02-21 MED ORDER — OXYCODONE HCL 5 MG PO CAPS
5.0000 mg | ORAL_CAPSULE | Freq: Four times a day (QID) | ORAL | 0 refills | Status: DC | PRN
  Filled 2024-02-21: qty 20, 5d supply, fill #0

## 2024-02-21 MED ORDER — OXYCODONE HCL 5 MG PO TABS
5.0000 mg | ORAL_TABLET | Freq: Four times a day (QID) | ORAL | 0 refills | Status: AC | PRN
  Filled 2024-02-21: qty 20, 5d supply, fill #0

## 2024-02-21 NOTE — Telephone Encounter (Signed)
Patient called. Would like a refill on oxycodone.  °

## 2024-02-24 ENCOUNTER — Other Ambulatory Visit: Payer: Self-pay

## 2024-02-24 MED ORDER — OXYCODONE HCL 5 MG PO TABS
5.0000 mg | ORAL_TABLET | Freq: Three times a day (TID) | ORAL | 0 refills | Status: DC | PRN
  Filled 2024-02-24 – 2024-02-26 (×2): qty 21, 7d supply, fill #0

## 2024-02-26 ENCOUNTER — Other Ambulatory Visit: Payer: Self-pay

## 2024-03-03 ENCOUNTER — Ambulatory Visit (INDEPENDENT_AMBULATORY_CARE_PROVIDER_SITE_OTHER): Admitting: Family

## 2024-03-03 ENCOUNTER — Other Ambulatory Visit: Payer: Self-pay

## 2024-03-03 ENCOUNTER — Encounter: Payer: Self-pay | Admitting: Family

## 2024-03-03 DIAGNOSIS — M79605 Pain in left leg: Secondary | ICD-10-CM

## 2024-03-03 DIAGNOSIS — M86461 Chronic osteomyelitis with draining sinus, right tibia and fibula: Secondary | ICD-10-CM

## 2024-03-03 MED ORDER — OXYCODONE HCL 5 MG PO TABS
5.0000 mg | ORAL_TABLET | Freq: Three times a day (TID) | ORAL | 0 refills | Status: DC | PRN
  Filled 2024-03-03: qty 21, 7d supply, fill #0
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
## 2024-03-07 DIAGNOSIS — Z419 Encounter for procedure for purposes other than remedying health state, unspecified: Secondary | ICD-10-CM | POA: Diagnosis not present

## 2024-03-10 ENCOUNTER — Other Ambulatory Visit: Payer: Self-pay | Admitting: Orthopedic Surgery

## 2024-03-10 ENCOUNTER — Other Ambulatory Visit: Payer: Self-pay

## 2024-03-10 ENCOUNTER — Telehealth: Payer: Self-pay | Admitting: Family

## 2024-03-10 NOTE — Telephone Encounter (Signed)
 We already got a request, this is duplicate. Sent to [facility].

## 2024-03-10 NOTE — Telephone Encounter (Signed)
 Pt called requesting refill of oxycodone  be sent to [facility]. Pt phone number is [phone].

## 2024-03-11 ENCOUNTER — Other Ambulatory Visit: Payer: Self-pay

## 2024-03-11 MED FILL — Oxycodone HCl Tab 5 MG: ORAL | 7 days supply | Qty: 21 | Fill #0 | Status: AC

## 2024-03-17 ENCOUNTER — Other Ambulatory Visit: Payer: Self-pay

## 2024-03-20 ENCOUNTER — Other Ambulatory Visit: Payer: Self-pay

## 2024-03-20 ENCOUNTER — Other Ambulatory Visit: Payer: Self-pay | Admitting: Orthopedic Surgery

## 2024-03-24 ENCOUNTER — Other Ambulatory Visit (INDEPENDENT_AMBULATORY_CARE_PROVIDER_SITE_OTHER): Payer: Self-pay

## 2024-03-24 ENCOUNTER — Other Ambulatory Visit: Payer: Self-pay

## 2024-03-24 ENCOUNTER — Encounter: Payer: Self-pay | Admitting: Family

## 2024-03-24 ENCOUNTER — Ambulatory Visit: Admitting: Family

## 2024-03-24 DIAGNOSIS — M79605 Pain in left leg: Secondary | ICD-10-CM

## 2024-03-24 MED ORDER — OXYCODONE HCL 5 MG PO TABS
5.0000 mg | ORAL_TABLET | Freq: Three times a day (TID) | ORAL | 0 refills | Status: DC | PRN
  Filled 2024-03-24: qty 21, 7d supply, fill #0
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
## 2024-04-07 DIAGNOSIS — Z419 Encounter for procedure for purposes other than remedying health state, unspecified: Secondary | ICD-10-CM | POA: Diagnosis not present

## 2024-04-14 ENCOUNTER — Telehealth: Payer: Self-pay | Admitting: Family

## 2024-04-14 ENCOUNTER — Encounter: Admitting: Family

## 2024-04-14 ENCOUNTER — Other Ambulatory Visit: Payer: Self-pay

## 2024-04-14 MED ORDER — OXYCODONE HCL 5 MG PO TABS
5.0000 mg | ORAL_TABLET | Freq: Two times a day (BID) | ORAL | 0 refills | Status: DC | PRN
  Filled 2024-04-14: qty 14, 7d supply, fill #0

## 2024-04-14 NOTE — Telephone Encounter (Signed)
 Pt called requesting a refill of pain medication. Please send to pharmacy on file. Pt phone number is [phone]

## 2024-04-15 ENCOUNTER — Other Ambulatory Visit: Payer: Self-pay | Admitting: Family Medicine

## 2024-04-15 NOTE — Telephone Encounter (Unsigned)
 Copied from CRM [id number]. Topic: Clinical - Medication Refill >> Apr 15, 2024  3:43 PM Rachelle R wrote: Medication: sildenafil  (VIAGRA ) 100 MG tablet  Has the patient contacted their pharmacy? Yes, Call Dr  This is the patient's preferred pharmacy:  [facility] [address], [address] [place] [address] Phone: [phone] Fax: [phone]  Is this the correct pharmacy for this prescription? Yes If no, delete pharmacy and type the correct one.   Has the prescription been filled recently? No  Is the patient out of the medication? No  Has the patient been seen for an appointment in the last year OR does the patient have an upcoming appointment? Yes  Can we respond through MyChart? Yes  Agent: Please be advised that Rx refills may take up to 3 business days. We ask that you follow-up with your pharmacy.

## 2024-04-16 ENCOUNTER — Telehealth: Payer: Self-pay | Admitting: Orthopedic Surgery

## 2024-04-16 ENCOUNTER — Other Ambulatory Visit: Payer: Self-pay

## 2024-04-16 MED ORDER — SILDENAFIL CITRATE 100 MG PO TABS
50.0000 mg | ORAL_TABLET | Freq: Every day | ORAL | 11 refills | Status: AC | PRN
  Filled 2024-04-16: qty 5, 15d supply, fill #0
  Filled 2024-05-01: qty 5, 15d supply, fill #1
  Filled 2024-05-13: qty 5, 15d supply, fill #2
  Filled 2024-05-27: qty 5, 15d supply, fill #3
  Filled 2024-06-12: qty 5, 15d supply, fill #4
  Filled 2024-06-24: qty 5, 15d supply, fill #5

## 2024-04-16 NOTE — Telephone Encounter (Signed)
 You last saw this pt in the office. He is s/p a left tib osteotomy on 01/22/2024. Please see the message below and advise what the restrictions should be for this pt.

## 2024-04-16 NOTE — Telephone Encounter (Signed)
 Requested medications are due for refill today.  unsure  Requested medications are on the active medications list.  yes  Last refill. 10/02/2023 #5 11 rf  Future visit scheduled.   yes  Notes to clinic.  New medication to this pt. Pt had 11 refills. Please review for refill.    Requested Prescriptions  Pending Prescriptions Disp Refills   sildenafil  (VIAGRA ) 100 MG tablet 5 tablet 11    Sig: Take 0.5-1 tablets (50-100 mg total) by mouth daily as needed for erectile dysfunction.     Urology: Erectile Dysfunction Agents Failed - 04/16/2024  4:08 PM      Failed - AST in normal range and within 360 days    AST  Date Value Ref Range Status  12/25/2023 48 (H) 15 - 41 U/L Final         Failed - ALT in normal range and within 360 days    ALT  Date Value Ref Range Status  12/25/2023 98 (H) 0 - 44 U/L Final         Passed - Last BP in normal range    BP Readings from Last 1 Encounters:  02/18/24 129/87         Passed - Valid encounter within last 12 months    Recent Outpatient Visits           3 months ago Uncontrolled hypertension   [facility], MD   4 months ago Pain of left lower extremity   [facility] [place], MD   6 months ago Nonintractable headache, unspecified chronicity pattern, unspecified headache type   [facility], MD   1 year ago Anxiety state   [facility], MD

## 2024-04-16 NOTE — Telephone Encounter (Signed)
 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 He needs details of what he can't do and why. He stated that he can't up and down the stairs and across the building. He said he can't apply pressure. If you can will you fax it to [phone] and the email address is [email or web address] CB# [phone]

## 2024-04-17 ENCOUNTER — Other Ambulatory Visit: Payer: Self-pay

## 2024-04-17 ENCOUNTER — Telehealth: Payer: Self-pay | Admitting: Family

## 2024-04-17 NOTE — Telephone Encounter (Signed)
 This is a very different message than the one we received earlier. I am assuming the patient is wanting a letter stating that he is on pain medication and should not have to go to court? See message below and let me know if the pt is able to go to court or has any restrictions in proceeding with his court appearance.

## 2024-04-17 NOTE — Telephone Encounter (Signed)
 Pt states he is also on pain meds and lawyer need that letter to give to the judge so they can left his warrant for arrest. Please see last chart note and please fax letter pt pt's lawyer. Please call pt when letter has be faxed please. Pt number is [phone].

## 2024-04-17 NOTE — Telephone Encounter (Signed)
 I faxed a letter to the above number

## 2024-04-22 ENCOUNTER — Encounter: Admitting: Family

## 2024-05-01 ENCOUNTER — Other Ambulatory Visit: Payer: Self-pay | Admitting: Family

## 2024-05-01 ENCOUNTER — Other Ambulatory Visit: Payer: Self-pay

## 2024-05-01 MED ORDER — OXYCODONE HCL 5 MG PO TABS
5.0000 mg | ORAL_TABLET | Freq: Two times a day (BID) | ORAL | 0 refills | Status: DC | PRN
  Filled 2024-05-01: qty 14, 7d supply, fill #0

## 2024-05-04 ENCOUNTER — Other Ambulatory Visit: Payer: Self-pay

## 2024-05-08 DIAGNOSIS — Z419 Encounter for procedure for purposes other than remedying health state, unspecified: Secondary | ICD-10-CM | POA: Diagnosis not present

## 2024-05-13 ENCOUNTER — Other Ambulatory Visit: Payer: Self-pay | Admitting: Orthopedic Surgery

## 2024-05-13 ENCOUNTER — Other Ambulatory Visit: Payer: Self-pay

## 2024-05-14 ENCOUNTER — Other Ambulatory Visit: Payer: Self-pay

## 2024-05-14 MED ORDER — OXYCODONE HCL 5 MG PO TABS
5.0000 mg | ORAL_TABLET | Freq: Two times a day (BID) | ORAL | 0 refills | Status: DC | PRN
  Filled 2024-05-14: qty 14, 7d supply, fill #0

## 2024-05-15 ENCOUNTER — Other Ambulatory Visit: Payer: Self-pay

## 2024-05-27 ENCOUNTER — Other Ambulatory Visit: Payer: Self-pay | Admitting: Family Medicine

## 2024-05-27 ENCOUNTER — Other Ambulatory Visit: Payer: Self-pay

## 2024-05-27 ENCOUNTER — Other Ambulatory Visit: Payer: Self-pay | Admitting: Orthopedic Surgery

## 2024-05-27 MED ORDER — ALBUTEROL SULFATE HFA 108 (90 BASE) MCG/ACT IN AERS
1.0000 | INHALATION_SPRAY | Freq: Four times a day (QID) | RESPIRATORY_TRACT | 1 refills | Status: AC | PRN
  Filled 2024-05-27: qty 54, 75d supply, fill #0
  Filled 2024-07-02: qty 54, 75d supply, fill #1

## 2024-05-28 ENCOUNTER — Encounter: Payer: Self-pay | Admitting: Pharmacist

## 2024-05-28 ENCOUNTER — Other Ambulatory Visit: Payer: Self-pay

## 2024-05-29 ENCOUNTER — Ambulatory Visit: Admitting: Family Medicine

## 2024-06-12 ENCOUNTER — Other Ambulatory Visit: Payer: Self-pay

## 2024-06-15 ENCOUNTER — Emergency Department (HOSPITAL_BASED_OUTPATIENT_CLINIC_OR_DEPARTMENT_OTHER)
Admission: EM | Admit: 2024-06-15 | Discharge: 2024-06-15 | Disposition: A | Discharge status: Home / Self Care | Attending: Emergency Medicine | Admitting: Emergency Medicine

## 2024-06-15 ENCOUNTER — Other Ambulatory Visit: Payer: Self-pay

## 2024-06-15 ENCOUNTER — Encounter (HOSPITAL_BASED_OUTPATIENT_CLINIC_OR_DEPARTMENT_OTHER): Payer: Self-pay

## 2024-06-15 ENCOUNTER — Emergency Department (HOSPITAL_BASED_OUTPATIENT_CLINIC_OR_DEPARTMENT_OTHER): Admitting: Radiology

## 2024-06-15 DIAGNOSIS — M25511 Pain in right shoulder: Secondary | ICD-10-CM | POA: Diagnosis not present

## 2024-06-15 DIAGNOSIS — R936 Abnormal findings on diagnostic imaging of limbs: Secondary | ICD-10-CM | POA: Diagnosis not present

## 2024-06-15 MED ORDER — MELOXICAM 7.5 MG PO TABS
7.5000 mg | ORAL_TABLET | Freq: Every day | ORAL | 0 refills | Status: AC
  Filled 2024-06-15: qty 30, 30d supply, fill #0

## 2024-06-15 MED ORDER — OXYCODONE HCL 5 MG PO TABS
5.0000 mg | ORAL_TABLET | Freq: Once | ORAL | Status: AC
  Administered 2024-06-15: 5 mg via ORAL
  Filled 2024-06-15: qty 1

## 2024-06-15 MED ORDER — DICLOFENAC SODIUM 1 % EX GEL
2.0000 g | Freq: Four times a day (QID) | CUTANEOUS | 0 refills | Status: AC
  Filled 2024-06-15: qty 100, 5d supply, fill #0

## 2024-06-15 MED ORDER — METHOCARBAMOL 500 MG PO TABS
500.0000 mg | ORAL_TABLET | Freq: Two times a day (BID) | ORAL | 0 refills | Status: AC
  Filled 2024-06-15: qty 20, 10d supply, fill #0

## 2024-06-15 NOTE — ED Triage Notes (Signed)
 Presents to ED with c/o R shoulder pain that radiates into neck. Ongoing for 3 weeks. Denies any injury or overuse.

## 2024-06-15 NOTE — ED Provider Notes (Signed)
 [place] EMERGENCY DEPARTMENT AT [facility] Provider Note   CSN: [id number] Arrival date & time: 06/15/24  1009     Patient presents with: Shoulder Pain   William Kane is a 43 y.o. male patient with past medical history of peripheral neuropathy, cannabis use disorder, alcohol abuse presents to emergency room with complaint of right shoulder pain.  Patient reports that his right shoulder has been hurting for approximately 3 weeks and seems to be gradually worsening.  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  Also notes this started after he began working out approximately 4 weeks ago.  He locates his pain to posterior shoulder and it is specifically worse when abducting his arm.  He denies any numbness or tingling sensation.  No specific injury.  Has not really tried anything at home.    Shoulder Pain      Prior to Admission medications   Medication Sig Start Date End Date Taking? Authorizing Provider  albuterol  (VENTOLIN  HFA) 108 (90 Base) MCG/ACT inhaler Inhale 1-2 puffs into the lungs every 6 (six) hours as needed for wheezing or shortness of breath. 05/27/24   Tanda Bleacher, MD  chlorhexidine  (PERIDEX ) 0.12 % solution Use as directed 15 mLs in the mouth or throat 2 (two) times daily. Patient taking differently: Use as directed 15 mLs in the mouth or throat daily. 01/06/24   Silver Wonda LABOR, PA  cyclobenzaprine  (FLEXERIL ) 10 MG tablet Take 1 tablet (10 mg total) by mouth 2 (two) times daily as needed for muscle spasms. 11/17/23   Silver Wonda LABOR, PA  dicyclomine  (BENTYL ) 20 MG tablet Take 1 tablet (20 mg total) by mouth 3 (three) times daily as needed for spasms. Patient not taking: Reported on 01/21/2024 07/25/23   Jhonny Calvin NOVAK, MD  lisinopril -hydrochlorothiazide  (ZESTORETIC ) 10-12.5 MG tablet Take 1 tablet by mouth daily. 01/01/24   Tanda Bleacher, MD  mometasone -formoterol   (DULERA ) 100-5 MCG/ACT AERO Inhale 2 puffs into the lungs 2 (two) times daily. 11/25/23   Tanda Bleacher, MD  ondansetron  (ZOFRAN ) 4 MG tablet Take 1 tablet (4 mg total) by mouth every 6 (six) hours as needed for nausea. Patient not taking: Reported on 01/21/2024 07/25/23   Jhonny Calvin NOVAK, MD  oxyCODONE  (OXY IR/ROXICODONE ) 5 MG immediate release tablet Take 1 tablet (5 mg total) by mouth every 12 (twelve) hours as needed for severe pain (pain score 7-10). 05/14/24   Harden Jerona GAILS, MD  sildenafil  (VIAGRA ) 100 MG tablet Take 0.5-1 tablets (50-100 mg total) by mouth daily as needed for erectile dysfunction. 04/16/24   Tanda Bleacher, MD    Allergies: Naproxen     Review of Systems  Musculoskeletal:  Positive for arthralgias.    Updated Vital Signs BP (!) 126/95   Pulse 100   Temp 98.7 F (37.1 C) (Oral)   Resp 18   Ht 5' 10 (1.778 m)   Wt 99.8 kg   SpO2 97%   BMI 31.57 kg/m   Physical Exam Vitals and nursing note reviewed.  Constitutional:      General: He is not in acute distress.    Appearance: He is not toxic-appearing.  HENT:     Head: Normocephalic and atraumatic.  Eyes:     General: No scleral icterus.    Conjunctiva/sclera: Conjunctivae normal.  Neck:     Comments: No cervical midline tenderness step-off or deformity. Cardiovascular:     Rate and Rhythm: Normal rate  and regular rhythm.     Pulses: Normal pulses.     Heart sounds: Normal heart sounds.  Pulmonary:     Effort: Pulmonary effort is normal. No respiratory distress.     Breath sounds: Normal breath sounds.  Abdominal:     General: Abdomen is flat. Bowel sounds are normal.     Palpations: Abdomen is soft.     Tenderness: There is no abdominal tenderness.  Musculoskeletal:       Arms:     Comments: Reproducible tenderness over posterior deltoid muscle.  Patient has good active range of motion but does struggle bringing arm above 90 degrees with abduction.  Good passive range of motion.  He is  neurovascularly intact distally.  Skin:    General: Skin is warm and dry.     Findings: No lesion.  Neurological:     General: No focal deficit present.     Mental Status: He is alert and oriented to person, place, and time. Mental status is at baseline.     (all labs ordered are listed, but only abnormal results are displayed) Labs Reviewed - No data to display  EKG: None  Radiology: DG Shoulder Right Result Date: 06/15/2024 CLINICAL DATA:  shoulder pain EXAM: RIGHT SHOULDER - 2+ VIEW COMPARISON:  11/17/2023 FINDINGS: No acute fracture or dislocation. Subtle well corticated ossific fragment subjacent to the glenoid, unchanged, possibly due to remote trauma. There is no evidence of arthropathy or other focal bone abnormality. Soft tissues are unremarkable. IMPRESSION: No acute fracture or dislocation. Electronically Signed   By: Rogelia Myers M.D.   On: 06/15/2024 12:12     Procedures   Medications Ordered in the ED - No data to display                                  Medical Decision Making Amount and/or Complexity of Data Reviewed Radiology: ordered.  Risk Prescription drug management.   This patient presents to the ED for concern of right shoulder pain, this involves an extensive number of treatment options, and is a complaint that carries with it a high risk of complications and morbidity.  The differential diagnosis includes fracture, septic joint, cellulitis, contusion, bursitis, tendonitis    Imaging Studies ordered:  I ordered imaging studies including right shoulder x-ray  I independently visualized and interpreted imaging which showed no acute fracture or dislocation, subtle well ossific fragment unchanged but possible remote trauma in the past. I agree with the radiologist interpretation   Cardiac Monitoring: / EKG:  The patient was maintained on a cardiac monitor.     Problem List / ED Course / Critical interventions / Medication  management  Patient presents with complaint of 3 weeks of right shoulder pain.  Patient is hemodynamically stable and well-appearing.  He locates his shoulder pain to posterior aspect of his right shoulder.  This has been ongoing and worsening over the past 3 weeks.  It is specifically worse when he is trying to raise his arm over his head.  On my exam he has difficulty with abduction over 90 degrees.  He has significant tenderness over the posterior aspect of his shoulder near posterior deltoid.  He is neurovascularly intact.  He has no evidence of fracture.  I suspect this is musculoskeletal in nature.  Symptoms do not seem consistent with a septic joint or cellulitis/infection. I ordered medication including oxycodone  for pain control.  I  did offer a sling to use as needed patient declined. Reevaluation of the patient after these medicines showed that the patient improved I have reviewed the patients home medicines and have made adjustments as needed. Patient reassuring exam here.  Feel stable for discharge with orthopedic follow-up.  He is already established with orthopedics.  Would suggest conservative management with anti-inflammatories.  Given return precautions.        Final diagnoses:  Acute pain of right shoulder    ED Discharge Orders          Ordered    methocarbamol (ROBAXIN) 500 MG tablet  2 times daily        06/15/24 1248    diclofenac  Sodium (VOLTAREN ) 1 % GEL  4 times daily       Note to Pharmacy: package size   06/15/24 1248    meloxicam  (MOBIC ) 7.5 MG tablet  Daily        06/15/24 1248               Hibo Blasdell, Warren SAILOR, PA-C 06/15/24 1336    Jerrol Agent, MD 06/16/24 [phone]

## 2024-06-15 NOTE — Discharge Instructions (Addendum)
 Take meloxicam  once daily but make sure you take this medicine for food.  You can take Tylenol  1000 mg up to every 8 hours.  Use ice or heat over area of pain.  You can also try Voltaren  gel.  Take Robaxin as needed for muscle spasm but do not drive or operate heavy machinery when taking this medicine.  Follow-up with orthopedics.  You will need to call to schedule appointment.

## 2024-06-17 ENCOUNTER — Other Ambulatory Visit (HOSPITAL_BASED_OUTPATIENT_CLINIC_OR_DEPARTMENT_OTHER): Payer: Self-pay

## 2024-06-17 ENCOUNTER — Telehealth: Payer: Self-pay | Admitting: Orthopedic Surgery

## 2024-06-17 MED ORDER — OXYCODONE HCL 5 MG PO TABS
5.0000 mg | ORAL_TABLET | Freq: Two times a day (BID) | ORAL | 0 refills | Status: AC | PRN
  Filled 2024-06-17 – 2024-06-18 (×2): qty 10, 5d supply, fill #0

## 2024-06-17 NOTE — Telephone Encounter (Signed)
 Pt called stating that he needs his Oxy cotton refilled Pharmacy is [facility] on [address]. Pt would like call back when called in. Pt call back number is [phone]

## 2024-06-18 ENCOUNTER — Ambulatory Visit: Admitting: Orthopedic Surgery

## 2024-06-18 ENCOUNTER — Other Ambulatory Visit (HOSPITAL_COMMUNITY): Payer: Self-pay

## 2024-06-18 ENCOUNTER — Other Ambulatory Visit: Payer: Self-pay

## 2024-06-18 ENCOUNTER — Other Ambulatory Visit (HOSPITAL_BASED_OUTPATIENT_CLINIC_OR_DEPARTMENT_OTHER): Payer: Self-pay

## 2024-06-24 ENCOUNTER — Other Ambulatory Visit: Payer: Self-pay

## 2024-06-29 ENCOUNTER — Encounter: Payer: Self-pay | Admitting: Radiology

## 2024-07-02 ENCOUNTER — Other Ambulatory Visit (HOSPITAL_COMMUNITY): Payer: Self-pay

## 2024-07-02 ENCOUNTER — Other Ambulatory Visit: Payer: Self-pay

## 2024-07-08 DIAGNOSIS — Z419 Encounter for procedure for purposes other than remedying health state, unspecified: Secondary | ICD-10-CM | POA: Diagnosis not present

## 2024-07-15 ENCOUNTER — Encounter: Payer: Self-pay | Admitting: Oncology

## 2024-08-07 DIAGNOSIS — Z419 Encounter for procedure for purposes other than remedying health state, unspecified: Secondary | ICD-10-CM | POA: Diagnosis not present

## 2024-09-16 ENCOUNTER — Other Ambulatory Visit: Payer: Self-pay

## 2024-09-16 ENCOUNTER — Other Ambulatory Visit (HOSPITAL_BASED_OUTPATIENT_CLINIC_OR_DEPARTMENT_OTHER): Payer: Self-pay

## 2024-09-16 ENCOUNTER — Emergency Department (HOSPITAL_BASED_OUTPATIENT_CLINIC_OR_DEPARTMENT_OTHER): Admitting: Radiology

## 2024-09-16 ENCOUNTER — Emergency Department (HOSPITAL_BASED_OUTPATIENT_CLINIC_OR_DEPARTMENT_OTHER)
Admission: EM | Admit: 2024-09-16 | Discharge: 2024-09-16 | Disposition: A | Discharge status: Home / Self Care | Source: Home / Self Care | Attending: Emergency Medicine | Admitting: Emergency Medicine

## 2024-09-16 DIAGNOSIS — I10 Essential (primary) hypertension: Secondary | ICD-10-CM | POA: Diagnosis not present

## 2024-09-16 DIAGNOSIS — R052 Subacute cough: Secondary | ICD-10-CM | POA: Diagnosis not present

## 2024-09-16 DIAGNOSIS — J45909 Unspecified asthma, uncomplicated: Secondary | ICD-10-CM | POA: Insufficient documentation

## 2024-09-16 DIAGNOSIS — Z79899 Other long term (current) drug therapy: Secondary | ICD-10-CM | POA: Insufficient documentation

## 2024-09-16 DIAGNOSIS — R490 Dysphonia: Secondary | ICD-10-CM | POA: Insufficient documentation

## 2024-09-16 DIAGNOSIS — M2550 Pain in unspecified joint: Secondary | ICD-10-CM | POA: Insufficient documentation

## 2024-09-16 DIAGNOSIS — Z7951 Long term (current) use of inhaled steroids: Secondary | ICD-10-CM | POA: Insufficient documentation

## 2024-09-16 DIAGNOSIS — E876 Hypokalemia: Secondary | ICD-10-CM | POA: Diagnosis not present

## 2024-09-16 DIAGNOSIS — K047 Periapical abscess without sinus: Secondary | ICD-10-CM | POA: Diagnosis not present

## 2024-09-16 DIAGNOSIS — R059 Cough, unspecified: Secondary | ICD-10-CM | POA: Diagnosis present

## 2024-09-16 LAB — COMPREHENSIVE METABOLIC PANEL WITH GFR
ALT: 37 U/L (ref 0–44)
AST: 34 U/L (ref 15–41)
Albumin: 4.5 g/dL (ref 3.5–5.0)
Alkaline Phosphatase: 55 U/L (ref 38–126)
Anion gap: 16 — ABNORMAL HIGH (ref 5–15)
BUN: 8 mg/dL (ref 6–20)
CO2: 24 mmol/L (ref 22–32)
Calcium: 9.9 mg/dL (ref 8.9–10.3)
Chloride: 100 mmol/L (ref 98–111)
Creatinine, Ser: 0.93 mg/dL (ref 0.61–1.24)
GFR, Estimated: >60 mL/min
Glucose, Bld: 99 mg/dL (ref 70–99)
Potassium: 3.4 mmol/L — ABNORMAL LOW (ref 3.5–5.1)
Sodium: 139 mmol/L (ref 135–145)
Total Bilirubin: 0.6 mg/dL (ref 0.0–1.2)
Total Protein: 7.7 g/dL (ref 6.5–8.1)

## 2024-09-16 LAB — CBC WITH DIFFERENTIAL/PLATELET
Abs Immature Granulocytes: 0.02 K/uL (ref 0.00–0.07)
Basophils Absolute: 0.1 K/uL (ref 0.0–0.1)
Basophils Relative: 1 %
Eosinophils Absolute: 0.2 K/uL (ref 0.0–0.5)
Eosinophils Relative: 5 %
HCT: 44.4 % (ref 39.0–52.0)
Hemoglobin: 15.6 g/dL (ref 13.0–17.0)
Immature Granulocytes: 0 %
Lymphocytes Relative: 34 %
Lymphs Abs: 1.7 K/uL (ref 0.7–4.0)
MCH: 34.1 pg — ABNORMAL HIGH (ref 26.0–34.0)
MCHC: 35.1 g/dL (ref 30.0–36.0)
MCV: 96.9 fL (ref 80.0–100.0)
Monocytes Absolute: 0.9 K/uL (ref 0.1–1.0)
Monocytes Relative: 18 %
Neutro Abs: 2.1 K/uL (ref 1.7–7.7)
Neutrophils Relative %: 42 %
Platelets: 279 K/uL (ref 150–400)
RBC: 4.58 MIL/uL (ref 4.22–5.81)
RDW: 12.8 % (ref 11.5–15.5)
WBC: 5.1 K/uL (ref 4.0–10.5)
nRBC: 0 % (ref 0.0–0.2)

## 2024-09-16 MED ORDER — HYDROCODONE-ACETAMINOPHEN 5-325 MG PO TABS
1.0000 | ORAL_TABLET | Freq: Once | ORAL | Status: AC
  Administered 2024-09-16: 1 via ORAL
  Filled 2024-09-16: qty 1

## 2024-09-16 MED ORDER — BENZONATATE 100 MG PO CAPS
100.0000 mg | ORAL_CAPSULE | Freq: Three times a day (TID) | ORAL | 0 refills | Status: AC
  Filled 2024-09-16: qty 21, 7d supply, fill #0

## 2024-09-16 MED ORDER — CLINDAMYCIN HCL 300 MG PO CAPS
300.0000 mg | ORAL_CAPSULE | Freq: Three times a day (TID) | ORAL | 0 refills | Status: AC
  Filled 2024-09-16: qty 21, 7d supply, fill #0

## 2024-09-16 MED ORDER — METHOCARBAMOL 500 MG PO TABS
500.0000 mg | ORAL_TABLET | Freq: Once | ORAL | Status: AC
  Administered 2024-09-16: 500 mg via ORAL
  Filled 2024-09-16: qty 1

## 2024-09-16 MED ORDER — MELOXICAM 7.5 MG PO TABS
7.5000 mg | ORAL_TABLET | Freq: Every day | ORAL | 0 refills | Status: AC
  Filled 2024-09-16: qty 15, 15d supply, fill #0

## 2024-09-16 MED ORDER — METHOCARBAMOL 500 MG PO TABS
500.0000 mg | ORAL_TABLET | Freq: Two times a day (BID) | ORAL | 0 refills | Status: AC
  Filled 2024-09-16: qty 20, 10d supply, fill #0

## 2024-09-16 NOTE — ED Triage Notes (Signed)
 Reports hoarseness x 1 month having flu like symptoms.  Also endorses multiple joint pain x 2 months.  Bilateral shoulder and R hip pain. Denies any injury to area.

## 2024-09-16 NOTE — ED Notes (Signed)
 DC paperwork given and verbally understood.

## 2024-09-16 NOTE — Discharge Instructions (Signed)
 It was a pleasure taking care of you today.  As discussed, your workup was reassuring.  X-rays were unremarkable.  I have included the number of the orthopedic surgeon.  Call to schedule an appointment for further evaluation.  I am sending you home with pain medication and muscle relaxers.  Take as needed.  Muscle relaxers can cause drowsiness so do not drive or operate machinery while on the medication.  I am also sending you home with an antibiotic for your dental infection.  Please follow-up with your dentist for further evaluation.  Chest x-ray did not show evidence of pneumonia.  I am sending you home with cough medication.  Take as needed.  Return to the ER for any worsening symptoms.

## 2024-09-16 NOTE — ED Provider Notes (Signed)
 " [place] EMERGENCY DEPARTMENT AT [facility] Provider Note   CSN: [id number] Arrival date & time: 09/16/24  1009     Patient presents with: Hoarse and Joint Pain   William Kane is a 44 y.o. male with a past medical history significant for asthma and hypertension who presents to the ED due to multiple complaints.  Patient notes he has had intermittent hoarseness in his voice for the past month.  Had flulike symptoms 1 to 1.5 months ago.  He admits to a persistent cough.  Occasionally productive cough with yellow phlegm.  Denies fever.  No longer having flulike symptoms however, cough has persisted.  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  Denies chest pain and shortness of breath. History of asthma. Denies sore throat.   Patient also admits to right shoulder, left shoulder, and right hip pain.  Patient notes pain has been present for a few months.  No direct injury.  Denies erythema, edema, and warmth to area.  Has been seen previously for right shoulder pain and was noted to have a bone spur per patient.  Has had surgery by Dr. Harden with orthopedics however, notes he needs to see another orthopedic surgeon for his joint pain.  Denies fever and chills. No recent tick bites. Denies rash. No penile discharge. Denies concerns for STIs; however wants testing.   Patient also concerned about a dental infection.  Saw a dentist 2 to 3 weeks ago and was placed on amoxicillin .  Notes pain and infection did not improve.  Notes he needs to get his wisdom teeth removed.  Is requesting another antibiotic for a possible infection. No trismus. Denies fever. No facial edema.  History obtained from patient and past medical records. No interpreter used during encounter.        Prior to Admission medications  Medication Sig Start Date End Date Taking? Authorizing Provider  benzonatate  (TESSALON ) 100 MG capsule Take 1 capsule (100 mg total) by mouth every 8  (eight) hours. 09/16/24  Yes Ytzel Gubler, Aleck BROCKS, PA-C  clindamycin  (CLEOCIN ) 300 MG capsule Take 1 capsule (300 mg total) by mouth 3 (three) times daily for 7 days. 09/16/24 09/23/24 Yes Theodoros Stjames, Aleck BROCKS, PA-C  meloxicam  (MOBIC ) 7.5 MG tablet Take 1 tablet (7.5 mg total) by mouth daily. 09/16/24  Yes Sharley Keeler, Aleck BROCKS, PA-C  methocarbamol  (ROBAXIN ) 500 MG tablet Take 1 tablet (500 mg total) by mouth 2 (two) times daily. 09/16/24  Yes Frayda Egley, Aleck BROCKS, PA-C  albuterol  (VENTOLIN  HFA) 108 (90 Base) MCG/ACT inhaler Inhale 1-2 puffs into the lungs every 6 (six) hours as needed for wheezing or shortness of breath. 05/27/24   Tanda Bleacher, MD  chlorhexidine  (PERIDEX ) 0.12 % solution Use as directed 15 mLs in the mouth or throat 2 (two) times daily. Patient taking differently: Use as directed 15 mLs in the mouth or throat daily. 01/06/24   Silver Wonda LABOR, PA  cyclobenzaprine  (FLEXERIL ) 10 MG tablet Take 1 tablet (10 mg total) by mouth 2 (two) times daily as needed for muscle spasms. 11/17/23   Silver Wonda LABOR, PA  dicyclomine  (BENTYL ) 20 MG tablet Take 1 tablet (20 mg total) by mouth 3 (three) times daily as needed for spasms. Patient not taking: Reported on 01/21/2024 07/25/23   Jhonny Calvin NOVAK, MD  lisinopril -hydrochlorothiazide  (ZESTORETIC ) 10-12.5 MG tablet Take 1 tablet by mouth daily. 01/01/24   Tanda Bleacher, MD  meloxicam  (MOBIC ) 7.5 MG tablet Take 1 tablet (7.5 mg  total) by mouth daily. 06/15/24   Barrett, Warren SAILOR, PA-C  methocarbamol  (ROBAXIN ) 500 MG tablet Take 1 tablet (500 mg total) by mouth 2 (two) times daily. 06/15/24   Barrett, Jamie N, PA-C  mometasone -formoterol  (DULERA ) 100-5 MCG/ACT AERO Inhale 2 puffs into the lungs 2 (two) times daily. 11/25/23   Tanda Bleacher, MD  ondansetron  (ZOFRAN ) 4 MG tablet Take 1 tablet (4 mg total) by mouth every 6 (six) hours as needed for nausea. Patient not taking: Reported on 01/21/2024 07/25/23   Jhonny Calvin NOVAK, MD  oxyCODONE  (OXY  IR/ROXICODONE ) 5 MG immediate release tablet Take 1 tablet (5 mg total) by mouth every 12 (twelve) hours as needed for severe pain (pain score 7-10). 06/17/24   Zamora, Erin R, NP  sildenafil  (VIAGRA ) 100 MG tablet Take 0.5-1 tablets (50-100 mg total) by mouth daily as needed for erectile dysfunction. 04/16/24   Tanda Bleacher, MD    Allergies: Naproxen     Review of Systems  Constitutional:  Negative for fever.  HENT:  Positive for dental problem and voice change. Negative for facial swelling and sore throat.   Respiratory:  Positive for cough. Negative for shortness of breath.   Cardiovascular:  Negative for chest pain.  Musculoskeletal:  Positive for arthralgias.    Updated Vital Signs BP (!) 119/93 (BP Location: Right Arm)   Pulse 98   Temp 97.9 F (36.6 C) (Oral)   Resp 16   SpO2 97%   Physical Exam Vitals and nursing note reviewed.  Constitutional:      General: He is not in acute distress.    Appearance: He is not ill-appearing.  HENT:     Head: Normocephalic.     Mouth/Throat:     Comments: Impacted wisdom teeth.  No abscess.  Tongue in normal position without protrusion.  No trismus.  No Facial edema. Eyes:     Pupils: Pupils are equal, round, and reactive to light.  Cardiovascular:     Rate and Rhythm: Normal rate and regular rhythm.     Pulses: Normal pulses.     Heart sounds: Normal heart sounds. No murmur heard.    No friction rub. No gallop.  Pulmonary:     Effort: Pulmonary effort is normal.     Breath sounds: Normal breath sounds.  Abdominal:     General: Abdomen is flat. There is no distension.     Palpations: Abdomen is soft.     Tenderness: There is no abdominal tenderness. There is no guarding or rebound.  Musculoskeletal:        General: Normal range of motion.     Cervical back: Neck supple.     Comments: No tenderness throughout shoulders or right hip.  No erythema, edema, or warmth.  Full range of motion.  Skin:    General: Skin is warm and dry.   Neurological:     General: No focal deficit present.     Mental Status: He is alert.  Psychiatric:        Mood and Affect: Mood normal.        Behavior: Behavior normal.     (all labs ordered are listed, but only abnormal results are displayed) Labs Reviewed  CBC WITH DIFFERENTIAL/PLATELET - Abnormal; Notable for the following components:      Result Value   MCH 34.1 (*)    All other components within normal limits  COMPREHENSIVE METABOLIC PANEL WITH GFR - Abnormal; Notable for the following components:   Potassium 3.4 (*)  Anion gap 16 (*)    All other components within normal limits  GC/CHLAMYDIA PROBE AMP ([place]) NOT AT [facility]    EKG: None  Radiology: DG Chest 2 View Result Date: 09/16/2024 EXAM: 2 VIEW(S) XRAY OF THE CHEST 09/16/2024 11:05:25 AM COMPARISON: cxr 09/04/23 CLINICAL HISTORY: cough . Reports hoarseness x 1 month having flu like symptoms. Also endorses multiple joint pain x 2 months. Bilateral shoulder and R hip pain. Denies any injury to area. FINDINGS: LUNGS AND PLEURA: No focal pulmonary opacity. No pleural effusion. No pneumothorax. HEART AND MEDIASTINUM: No acute abnormality of the cardiac and mediastinal silhouettes. BONES AND SOFT TISSUES: No acute osseous abnormality. IMPRESSION: 1. No acute process. Electronically signed by: Morgane Naveau MD 09/16/2024 11:20 AM EST RP Workstation: [id number]   DG Shoulder Left Result Date: 09/16/2024 EXAM: 1 VIEW(S) XRAY OF THE LEFT SHOULDER 09/16/2024 11:05:03 AM COMPARISON: None available. CLINICAL HISTORY: Reports hoarseness x 1 month having flu like symptoms. Also endorses multiple joint pain x 2 months. Bilateral shoulder and R hip pain. Denies any injury to area. FINDINGS: BONES AND JOINTS: Glenohumeral joint is normally aligned. No acute fracture. No malalignment. The [facility] joint is unremarkable. SOFT TISSUES: No abnormal calcifications. Visualized lung is unremarkable. IMPRESSION: 1. No acute findings. Electronically  signed by: Morgane Naveau MD 09/16/2024 11:20 AM EST RP Workstation: [id number]   DG Hip Unilat W or Wo Pelvis 2-3 Views Right Addendum Date: 09/16/2024 **ADDENDUM #1 ** ADDENDUM: history: Reports hoarseness x 1 month having flu like symptoms. Also endorses multiple joint pain x 2 months. Bilateral shoulder and R hip pain. Denies any injury to area. ---------------------------------------------------- Electronically signed by: Morgane Naveau MD 09/16/2024 11:19 AM EST RP Workstation: [id number]   Result Date: 09/16/2024 ** ORIGINAL REPORT ** EXAM: 2 OR 3 VIEW(S) XRAY OF THE UNILATERAL HIP 09/16/2024 11:04:38 AM COMPARISON: None available. CLINICAL HISTORY: cough FINDINGS: BONES AND JOINTS: No acute fracture. No malalignment. SOFT TISSUES: Unremarkable. IMPRESSION: 1. No acute findings. Electronically signed by: Morgane Naveau MD 09/16/2024 11:18 AM EST RP Workstation: [id number]   DG Shoulder Right Result Date: 09/16/2024 EXAM: 1 VIEW(S) XRAY OF THE _LATERALITY_ SHOULDER 09/16/2024 11:05:46 AM COMPARISON: None available. CLINICAL HISTORY: Reports hoarseness x 1 month having flu like symptoms. Also endorses multiple joint pain x 2 months. Bilateral shoulder and R hip pain. Denies any injury to area. cough FINDINGS: BONES AND JOINTS: Glenohumeral joint is normally aligned. No acute fracture. No malalignment. The [facility] joint is unremarkable. SOFT TISSUES: No abnormal calcifications. Visualized lung is unremarkable. IMPRESSION: 1. No significant abnormality. Electronically signed by: kate plummer MD 09/16/2024 11:19 AM EST RP Workstation: [id number]     Procedures   Medications Ordered in the ED  HYDROcodone -acetaminophen  (NORCO/VICODIN) 5-325 MG per tablet 1 tablet (1 tablet Oral Given 09/16/24 1106)  methocarbamol  (ROBAXIN ) tablet 500 mg (500 mg Oral Given 09/16/24 1125)                                    Medical Decision Making Amount and/or Complexity of Data Reviewed Labs: ordered.  Decision-making details documented in ED Course. Radiology: ordered and independent interpretation performed. Decision-making details documented in ED Course.  Risk Prescription drug management.   This patient presents to the ED for concern of arthralgias, cough, dental pain, this involves an extensive number of treatment options, and is a complaint that carries with it a high risk of complications and morbidity.  The differential  diagnosis includes disseminated gonorrhea, arthritis, septic joint, pneumonia, Ludwigs, etc  44 year old male presents to the ED due to multiple complaints of hoarseness x 1 month associated with persistent cough, arthralgias (bilateral shoulders and right hip), and concern about a dental infection.  No fever or chills.  No injury to joints.  No recent tick bite or rash.  Denies concerns for STIs however, requesting testing for gonorrhea and chlamydia.  Upon arrival, patient afebrile, not tachycardic or hypoxic.  Patient well-appearing on exam.  Reassuring physical exam.  No erythema, edema, or warmth to joints.  Low suspicion for septic joint.  No direct injury so low suspicion for bony fracture.  Patient requesting x-rays.  Will obtain x-rays to rule out evidence of arthritis or other acute abnormalities.  Patient denies any recent tick bites or rash.  Low suspicion for tickborne illness.  Also denies any concern for STIs or penile symptoms.  Low suspicion for disseminated gonorrhea.  Suspect persistent cough likely a postviral cough however, will obtain chest x-ray to rule out evidence of pneumonia.  Hoarseness likely secondary to cough.  On exam patient has no dental abscess.  Patient already on amoxicillin  for dental infection.  Will treat with clindamycin . Routine labs ordered.  CBC reassuring.  No leukocytosis.  Normal hemoglobin.  CMP with slight hypokalemia at 3.4.  Small anion gap of 16.  Normal bicarb.  Normal renal function.  X-rays personally reviewed and  interpreted which are negative for any acute abnormalities.  No evidence of bony fracture.  No evidence of degenerative disease.  Chest x-ray negative for evidence of pneumonia.  Suspect hoarseness likely related to persistent cough.  Patient discharged with Tessalon  [place].  Will cover for dental infection with clindamycin  given patient was just on amoxicillin .  Unclear what is causing patient's joint pain given reassuring x-rays.  Will refer patient to orthopedics for further evaluation.  Patient discharged with meloxicam  and Robaxin .  No evidence of septic joint on exam.  No evidence of cellulitis.  Patient stable for discharge. Strict ED precautions discussed with patient. Patient states understanding and agrees to plan. Patient discharged home in no acute distress and stable vitals  Hx asthma Has PCP    Final diagnoses:  Arthralgia, unspecified joint  Subacute cough  Voice hoarseness  Dental infection    ED Discharge Orders          Ordered    clindamycin  (CLEOCIN ) 300 MG capsule  3 times daily        09/16/24 1203    benzonatate  (TESSALON ) 100 MG capsule  Every 8 hours        09/16/24 1203    meloxicam  (MOBIC ) 7.5 MG tablet  Daily        09/16/24 1203    methocarbamol  (ROBAXIN ) 500 MG tablet  2 times daily        09/16/24 1203               Cevin Rubinstein C, PA-C 09/16/24 1206    Tonia Chew, MD 09/16/24 1316  "

## 2024-09-17 LAB — GC/CHLAMYDIA PROBE AMP (~~LOC~~) NOT AT ARMC
Chlamydia: NEGATIVE
Comment: NEGATIVE
Comment: NORMAL
Neisseria Gonorrhea: NEGATIVE

## 2024-09-28 ENCOUNTER — Other Ambulatory Visit (HOSPITAL_BASED_OUTPATIENT_CLINIC_OR_DEPARTMENT_OTHER): Payer: Self-pay
